# Patient Record
Sex: Female | Born: 1955 | Race: White | Hispanic: No | Smoking: Never smoker
Health system: Southern US, Community
[De-identification: ages and names within clinical notes are randomized; demographics above are authoritative.]

## PROBLEM LIST (undated history)

## (undated) DIAGNOSIS — E119 Type 2 diabetes mellitus without complications: Secondary | ICD-10-CM

## (undated) DIAGNOSIS — T4145XA Adverse effect of unspecified anesthetic, initial encounter: Secondary | ICD-10-CM

## (undated) DIAGNOSIS — B019 Varicella without complication: Secondary | ICD-10-CM

## (undated) DIAGNOSIS — C801 Malignant (primary) neoplasm, unspecified: Secondary | ICD-10-CM

## (undated) DIAGNOSIS — F419 Anxiety disorder, unspecified: Secondary | ICD-10-CM

## (undated) DIAGNOSIS — E785 Hyperlipidemia, unspecified: Secondary | ICD-10-CM

## (undated) DIAGNOSIS — E7800 Pure hypercholesterolemia, unspecified: Secondary | ICD-10-CM

## (undated) DIAGNOSIS — G4733 Obstructive sleep apnea (adult) (pediatric): Secondary | ICD-10-CM

## (undated) DIAGNOSIS — K219 Gastro-esophageal reflux disease without esophagitis: Secondary | ICD-10-CM

## (undated) DIAGNOSIS — J45909 Unspecified asthma, uncomplicated: Secondary | ICD-10-CM

## (undated) DIAGNOSIS — I454 Nonspecific intraventricular block: Secondary | ICD-10-CM

## (undated) DIAGNOSIS — Z9989 Dependence on other enabling machines and devices: Secondary | ICD-10-CM

## (undated) DIAGNOSIS — I10 Essential (primary) hypertension: Secondary | ICD-10-CM

## (undated) DIAGNOSIS — T8859XA Other complications of anesthesia, initial encounter: Secondary | ICD-10-CM

## (undated) DIAGNOSIS — F329 Major depressive disorder, single episode, unspecified: Secondary | ICD-10-CM

## (undated) DIAGNOSIS — R05 Cough: Secondary | ICD-10-CM

## (undated) DIAGNOSIS — J189 Pneumonia, unspecified organism: Secondary | ICD-10-CM

## (undated) DIAGNOSIS — M1990 Unspecified osteoarthritis, unspecified site: Secondary | ICD-10-CM

## (undated) DIAGNOSIS — J42 Unspecified chronic bronchitis: Secondary | ICD-10-CM

## (undated) HISTORY — DX: Major depressive disorder, single episode, unspecified: F32.9

## (undated) HISTORY — PX: BACK SURGERY: SHX140

## (undated) HISTORY — DX: Unspecified asthma, uncomplicated: J45.909

## (undated) HISTORY — DX: Hyperlipidemia, unspecified: E78.5

## (undated) HISTORY — DX: Malignant (primary) neoplasm, unspecified: C80.1

## (undated) HISTORY — DX: Gastro-esophageal reflux disease without esophagitis: K21.9

## (undated) HISTORY — DX: Type 2 diabetes mellitus without complications: E11.9

## (undated) HISTORY — DX: Varicella without complication: B01.9

## (undated) HISTORY — DX: Cough: R05

## (undated) HISTORY — DX: Essential (primary) hypertension: I10

## (undated) HISTORY — DX: Anxiety disorder, unspecified: F41.9

---

## 1992-02-12 HISTORY — PX: CYST EXCISION: SHX5701

## 2003-02-12 ENCOUNTER — Encounter (INDEPENDENT_AMBULATORY_CARE_PROVIDER_SITE_OTHER): Payer: Self-pay | Admitting: Internal Medicine

## 2003-02-12 LAB — CONVERTED CEMR LAB: Pap Smear: NORMAL

## 2003-03-07 ENCOUNTER — Other Ambulatory Visit: Payer: Self-pay

## 2003-07-13 ENCOUNTER — Encounter (INDEPENDENT_AMBULATORY_CARE_PROVIDER_SITE_OTHER): Payer: Self-pay | Admitting: Internal Medicine

## 2003-07-13 LAB — CONVERTED CEMR LAB
Hgb A1c MFr Bld: 7.5 %
Hgb A1c MFr Bld: 7.5 %

## 2003-10-13 ENCOUNTER — Encounter (INDEPENDENT_AMBULATORY_CARE_PROVIDER_SITE_OTHER): Payer: Self-pay | Admitting: Internal Medicine

## 2003-10-13 LAB — CONVERTED CEMR LAB
Hgb A1c MFr Bld: 8.7 %
Hgb A1c MFr Bld: 8.9 %

## 2004-03-14 ENCOUNTER — Encounter (INDEPENDENT_AMBULATORY_CARE_PROVIDER_SITE_OTHER): Payer: Self-pay | Admitting: Internal Medicine

## 2004-03-14 LAB — CONVERTED CEMR LAB
Hgb A1c MFr Bld: 8.6 %
Hgb A1c MFr Bld: 8.6 %
Microalbumin U total vol: 11.1 mg/L
Microalbumin U total vol: 11.1 mg/L
Pap Smear: NORMAL

## 2004-03-16 ENCOUNTER — Ambulatory Visit: Payer: Self-pay | Admitting: Family Medicine

## 2004-03-23 ENCOUNTER — Ambulatory Visit: Payer: Self-pay | Admitting: Family Medicine

## 2004-03-23 ENCOUNTER — Other Ambulatory Visit: Admission: RE | Admit: 2004-03-23 | Discharge: 2004-03-23 | Payer: Self-pay | Admitting: Internal Medicine

## 2004-04-11 ENCOUNTER — Encounter (INDEPENDENT_AMBULATORY_CARE_PROVIDER_SITE_OTHER): Payer: Self-pay | Admitting: Internal Medicine

## 2004-04-11 LAB — CONVERTED CEMR LAB
Hgb A1c MFr Bld: 9.6 %
Hgb A1c MFr Bld: 9.6 %

## 2004-04-24 ENCOUNTER — Ambulatory Visit: Payer: Self-pay | Admitting: Family Medicine

## 2004-05-10 ENCOUNTER — Ambulatory Visit: Payer: Self-pay | Admitting: Family Medicine

## 2004-08-28 ENCOUNTER — Ambulatory Visit: Payer: Self-pay | Admitting: Internal Medicine

## 2004-09-11 ENCOUNTER — Ambulatory Visit: Payer: Self-pay | Admitting: Family Medicine

## 2004-09-11 ENCOUNTER — Encounter (INDEPENDENT_AMBULATORY_CARE_PROVIDER_SITE_OTHER): Payer: Self-pay | Admitting: Internal Medicine

## 2004-09-11 LAB — CONVERTED CEMR LAB
Hgb A1c MFr Bld: 9.6 %
Hgb A1c MFr Bld: 9.6 %

## 2004-10-12 ENCOUNTER — Ambulatory Visit: Payer: Self-pay | Admitting: Family Medicine

## 2004-11-11 ENCOUNTER — Encounter (INDEPENDENT_AMBULATORY_CARE_PROVIDER_SITE_OTHER): Payer: Self-pay | Admitting: Internal Medicine

## 2004-11-11 LAB — CONVERTED CEMR LAB
Hgb A1c MFr Bld: 7 %
Hgb A1c MFr Bld: 7 %
Microalbumin U total vol: 12.7 mg/L
Microalbumin U total vol: 12.7 mg/L

## 2004-11-23 ENCOUNTER — Ambulatory Visit: Payer: Self-pay | Admitting: Family Medicine

## 2004-11-29 ENCOUNTER — Ambulatory Visit: Payer: Self-pay | Admitting: Family Medicine

## 2004-12-12 HISTORY — PX: ANTERIOR CERVICAL DECOMP/DISCECTOMY FUSION: SHX1161

## 2004-12-27 ENCOUNTER — Ambulatory Visit (HOSPITAL_COMMUNITY): Admission: RE | Admit: 2004-12-27 | Discharge: 2004-12-28 | Payer: Self-pay | Admitting: Orthopaedic Surgery

## 2005-03-01 ENCOUNTER — Ambulatory Visit: Payer: Self-pay | Admitting: Family Medicine

## 2005-04-24 ENCOUNTER — Ambulatory Visit: Payer: Self-pay | Admitting: Family Medicine

## 2005-05-02 ENCOUNTER — Ambulatory Visit: Payer: Self-pay | Admitting: Family Medicine

## 2005-05-12 ENCOUNTER — Encounter (INDEPENDENT_AMBULATORY_CARE_PROVIDER_SITE_OTHER): Payer: Self-pay | Admitting: Internal Medicine

## 2005-05-12 LAB — CONVERTED CEMR LAB
Hgb A1c MFr Bld: 6.8 %
Hgb A1c MFr Bld: 6.8 %

## 2005-05-15 ENCOUNTER — Other Ambulatory Visit: Admission: RE | Admit: 2005-05-15 | Discharge: 2005-05-15 | Payer: Self-pay | Admitting: Family Medicine

## 2005-05-15 ENCOUNTER — Ambulatory Visit: Payer: Self-pay | Admitting: Family Medicine

## 2005-07-01 ENCOUNTER — Ambulatory Visit: Payer: Self-pay | Admitting: Family Medicine

## 2005-07-05 ENCOUNTER — Ambulatory Visit: Payer: Self-pay | Admitting: Family Medicine

## 2005-11-21 ENCOUNTER — Ambulatory Visit: Payer: Self-pay | Admitting: *Deleted

## 2005-11-21 ENCOUNTER — Observation Stay (HOSPITAL_COMMUNITY): Admission: EM | Admit: 2005-11-21 | Discharge: 2005-11-21 | Payer: Self-pay | Admitting: Emergency Medicine

## 2005-12-19 ENCOUNTER — Ambulatory Visit: Payer: Self-pay | Admitting: Family Medicine

## 2006-03-14 ENCOUNTER — Encounter (INDEPENDENT_AMBULATORY_CARE_PROVIDER_SITE_OTHER): Payer: Self-pay | Admitting: Internal Medicine

## 2006-03-14 LAB — CONVERTED CEMR LAB
Hgb A1c MFr Bld: 7.6 %
Microalbumin U total vol: 6.6 mg/L
Microalbumin U total vol: 9.6 mg/L

## 2006-04-09 ENCOUNTER — Ambulatory Visit: Payer: Self-pay | Admitting: Family Medicine

## 2006-04-09 LAB — CONVERTED CEMR LAB
ALT: 28 units/L (ref 0–40)
AST: 26 units/L (ref 0–37)
BUN: 17 mg/dL (ref 6–23)
CO2: 27 meq/L (ref 19–32)
Calcium: 9.4 mg/dL (ref 8.4–10.5)
Chloride: 103 meq/L (ref 96–112)
Cholesterol: 216 mg/dL (ref 0–200)
Creatinine, Ser: 0.6 mg/dL (ref 0.4–1.2)
Creatinine,U: 90.4 mg/dL
Direct LDL: 131.4 mg/dL
GFR calc Af Amer: 136 mL/min
GFR calc non Af Amer: 112 mL/min
Glucose, Bld: 182 mg/dL — ABNORMAL HIGH (ref 70–99)
HDL: 31.2 mg/dL — ABNORMAL LOW (ref >39.0)
Hgb A1c MFr Bld: 7.6 % — ABNORMAL HIGH (ref 4.6–6.0)
Microalb Creat Ratio: 6.6 mg/g (ref 0.0–30.0)
Microalb, Ur: 0.6 mg/dL (ref 0.0–1.9)
Potassium: 3.9 meq/L (ref 3.5–5.1)
Sodium: 138 meq/L (ref 135–145)
Total CHOL/HDL Ratio: 6.9
Triglycerides: 438 mg/dL (ref 0–149)
VLDL: 88 mg/dL — ABNORMAL HIGH (ref 0–40)

## 2006-07-25 ENCOUNTER — Encounter: Payer: Self-pay | Admitting: Internal Medicine

## 2006-07-25 DIAGNOSIS — I10 Essential (primary) hypertension: Secondary | ICD-10-CM

## 2006-07-25 DIAGNOSIS — E119 Type 2 diabetes mellitus without complications: Secondary | ICD-10-CM | POA: Insufficient documentation

## 2006-07-25 DIAGNOSIS — E785 Hyperlipidemia, unspecified: Secondary | ICD-10-CM | POA: Insufficient documentation

## 2006-07-25 HISTORY — DX: Essential (primary) hypertension: I10

## 2006-07-27 DIAGNOSIS — G4700 Insomnia, unspecified: Secondary | ICD-10-CM

## 2006-07-29 ENCOUNTER — Other Ambulatory Visit: Admission: RE | Admit: 2006-07-29 | Discharge: 2006-07-29 | Payer: Self-pay | Admitting: Family Medicine

## 2006-07-29 ENCOUNTER — Encounter (INDEPENDENT_AMBULATORY_CARE_PROVIDER_SITE_OTHER): Payer: Self-pay | Admitting: Internal Medicine

## 2006-07-29 ENCOUNTER — Ambulatory Visit: Payer: Self-pay | Admitting: Family Medicine

## 2006-07-29 DIAGNOSIS — H60399 Other infective otitis externa, unspecified ear: Secondary | ICD-10-CM | POA: Insufficient documentation

## 2006-07-29 DIAGNOSIS — N951 Menopausal and female climacteric states: Secondary | ICD-10-CM | POA: Insufficient documentation

## 2006-07-29 LAB — CONVERTED CEMR LAB: Pap Smear: NORMAL

## 2006-07-30 LAB — CONVERTED CEMR LAB
BUN: 13 mg/dL (ref 6–23)
CO2: 26 meq/L (ref 19–32)
Calcium: 9.5 mg/dL (ref 8.4–10.5)
Chloride: 104 meq/L (ref 96–112)
Cholesterol: 244 mg/dL (ref 0–200)
Creatinine, Ser: 0.7 mg/dL (ref 0.4–1.2)
Creatinine,U: 128.2 mg/dL
Direct LDL: 138.9 mg/dL
FSH: 47.5 milliintl units/mL
GFR calc Af Amer: 114 mL/min
GFR calc non Af Amer: 94 mL/min
Glucose, Bld: 188 mg/dL — ABNORMAL HIGH (ref 70–99)
HDL: 30.9 mg/dL — ABNORMAL LOW (ref >39.0)
Hgb A1c MFr Bld: 8 % — ABNORMAL HIGH (ref 4.6–6.0)
Microalb Creat Ratio: 3.9 mg/g (ref 0.0–30.0)
Microalb, Ur: 0.5 mg/dL (ref 0.0–1.9)
Potassium: 4 meq/L (ref 3.5–5.1)
Sodium: 138 meq/L (ref 135–145)
Total CHOL/HDL Ratio: 7.9
Triglycerides: 336 mg/dL (ref 0–149)
VLDL: 67 mg/dL — ABNORMAL HIGH (ref 0–40)

## 2006-07-31 ENCOUNTER — Ambulatory Visit: Payer: Self-pay | Admitting: Internal Medicine

## 2006-07-31 DIAGNOSIS — T50995A Adverse effect of other drugs, medicaments and biological substances, initial encounter: Secondary | ICD-10-CM

## 2006-08-19 ENCOUNTER — Encounter (INDEPENDENT_AMBULATORY_CARE_PROVIDER_SITE_OTHER): Payer: Self-pay | Admitting: Internal Medicine

## 2006-08-22 ENCOUNTER — Ambulatory Visit: Payer: Self-pay | Admitting: Family Medicine

## 2006-11-26 ENCOUNTER — Ambulatory Visit: Payer: Self-pay | Admitting: Internal Medicine

## 2006-11-27 ENCOUNTER — Telehealth (INDEPENDENT_AMBULATORY_CARE_PROVIDER_SITE_OTHER): Payer: Self-pay | Admitting: *Deleted

## 2007-01-01 ENCOUNTER — Ambulatory Visit: Payer: Self-pay | Admitting: Family Medicine

## 2007-04-02 ENCOUNTER — Telehealth: Payer: Self-pay | Admitting: Family Medicine

## 2007-04-22 ENCOUNTER — Telehealth (INDEPENDENT_AMBULATORY_CARE_PROVIDER_SITE_OTHER): Payer: Self-pay | Admitting: Internal Medicine

## 2007-04-28 ENCOUNTER — Telehealth (INDEPENDENT_AMBULATORY_CARE_PROVIDER_SITE_OTHER): Payer: Self-pay | Admitting: Internal Medicine

## 2007-05-08 ENCOUNTER — Encounter (INDEPENDENT_AMBULATORY_CARE_PROVIDER_SITE_OTHER): Payer: Self-pay | Admitting: Nurse Practitioner

## 2007-05-29 ENCOUNTER — Telehealth (INDEPENDENT_AMBULATORY_CARE_PROVIDER_SITE_OTHER): Payer: Self-pay | Admitting: Internal Medicine

## 2007-06-18 ENCOUNTER — Ambulatory Visit: Payer: Self-pay | Admitting: Family Medicine

## 2007-06-18 DIAGNOSIS — J069 Acute upper respiratory infection, unspecified: Secondary | ICD-10-CM | POA: Insufficient documentation

## 2007-07-31 ENCOUNTER — Encounter (INDEPENDENT_AMBULATORY_CARE_PROVIDER_SITE_OTHER): Payer: Self-pay | Admitting: Internal Medicine

## 2007-07-31 ENCOUNTER — Telehealth (INDEPENDENT_AMBULATORY_CARE_PROVIDER_SITE_OTHER): Payer: Self-pay | Admitting: *Deleted

## 2007-08-04 ENCOUNTER — Telehealth (INDEPENDENT_AMBULATORY_CARE_PROVIDER_SITE_OTHER): Payer: Self-pay | Admitting: Internal Medicine

## 2007-08-05 ENCOUNTER — Encounter (INDEPENDENT_AMBULATORY_CARE_PROVIDER_SITE_OTHER): Payer: Self-pay | Admitting: Internal Medicine

## 2008-01-25 ENCOUNTER — Encounter (INDEPENDENT_AMBULATORY_CARE_PROVIDER_SITE_OTHER): Payer: Self-pay | Admitting: *Deleted

## 2010-02-06 ENCOUNTER — Ambulatory Visit: Payer: Self-pay | Admitting: Family Medicine

## 2010-03-04 ENCOUNTER — Encounter: Payer: Self-pay | Admitting: *Deleted

## 2010-06-29 NOTE — H&P (Signed)
NAME:  Michaela Tucker, Michaela Tucker               ACCOUNT NO.:  [id number]   MEDICAL RECORD NO.:  [id number]          PATIENT TYPE:  EMS   LOCATION:  MAJO                         FACILITY:  [facility]   PHYSICIAN:  Noralyn Pick. Eden Emms, MD, FACCDATE OF BIRTH:  [birth year]   DATE OF ADMISSION:  11/20/2005  DATE OF DISCHARGE:                                HISTORY & PHYSICAL   CHIEF COMPLAINT:  Chest pain for one week.   HISTORY OF PRESENT ILLNESS:  [age]-year-old female with a history of  diabetes, hypertriglyceridemia, family history of heart disease and CHF, who  was well until one week ago when she began having staggering chest pain.  She described as a fluttering feeling in her heart, associated with left arm  numbness.  Today, the symptoms became worse and that they increased in  severity and the duration was longer than it had been earlier in the week.  These episodes have been occurring both at rest and on exertion with no  clear triggers or relief.  At the time of my exam, the patient was chest  pain, sitting in the emergency room.  During her 4 hour stay in the  emergency room, she reported that she had 1 recurrence of chest pain that  lasted for just a few seconds.   PAST MEDICAL HISTORY:  1. Diabetes.  2. Hypertriglyceridemia.   ALLERGIES:  CODEINE.   MEDICATIONS:  Metformin, glipizide, Byetta, lisinopril, TriCor and aspirin.   SOCIAL HISTORY:  Lives in [place] with her husband.   FAMILY HISTORY:  Mother died of lung cancer.  Father died of lung cancer.  Grandfather died of heart failure.   REVIEW OF SYSTEMS:  All other review of systems were negative.  Apparently,  the patient wishes to be full code.   PHYSICAL EXAM IN THE EMERGENCY ROOM:  VITAL SIGNS:  Temperature was 98.7.  Blood pressure was 121/62.  Pulse was 73.  Respiratory rate is 18.  Pulse  oximetry is 99%.  GENERAL:  She is in no apparent distress.  She is a mildly obese women.  HEENT EXAM:  Normocephalic, atraumatic.   Pupils equally round and reactive  to light.  Extraocular motions intact.  Dentition is good.  NECK:  Supple.  lymphadenopathy is none.  HEART:  Regular with no murmurs, rubs or gallops.  LUNGS:  Clear to auscultation with no wheezes, rales or rhonchi.  SKIN:  Shows no lesions.  ABDOMEN:  Soft, nontender.  Positive bowel sounds.  EXTREMITIES:  Show no clubbing, cyanosis or edema.  RECTAL EXAM:  Per the ER was heme negative.  MUSCULOSKELETAL:  Shows no gross deformities.  NEUROLOGICALLY:  The patient is alert and oriented x3.  Cranial nerves II-  XII grossly intact.  Strength 5/5 in all extremities and axial groups.  Normal sensation throughout with normal cerebellar function.   EKG shows a normal sinus rhythm with an interval __________ conduction delay  and a borderline first degree AV block with normal axis.   White count was not performed.  Hematocrit is 37.0, sodium was 139,  potassium 3.6, chloride 106, BUN 12, creatinine 0.8,  glucose 100.  Point of  care enzymes were negative x2.   ASSESSMENT/PLAN:  This is a 55 year old female with a history of chest pain  for one week and negative enzymes x2 via the point of care system.  Her EKG  shows no gross injury apparent.  However, given her history of diabetes, her  pretest probability for coronary artery disease is higher than would be for  a normal woman her 55.  Therefore, we will order exercise Cardiolite for  further risk/stratification with regards to coronary artery disease.  With  regards to risk factor modification, the patient has been on an excellent  outpatient regimen, including lisinopril with good diabetic control with her  metformin, glipizide and Byetta.  We will hold the metformin, however, in  lieu of potential tests down the road pending the stress test results.      Daniel B. Haithcock, MD   Electronically Signed     ______________________________  Noralyn Pick. Eden Emms, MD, [facility]    DBH/MEDQ  D:  11/21/2005   T:  11/21/2005  Job:  [id number]

## 2010-06-29 NOTE — Op Note (Signed)
NAME:  Michaela Tucker, SCHWEERS               ACCOUNT NO.:  [id number]   MEDICAL RECORD NO.:  [id number]          PATIENT TYPE:  AMB   LOCATION:  SDS                          FACILITY:  [facility]   PHYSICIAN:  Sharolyn Douglas, M.D.        DATE OF BIRTH:  [birth year]   DATE OF PROCEDURE:  12/27/2004  DATE OF DISCHARGE:                                 OPERATIVE REPORT   DIAGNOSIS:  Cervical spondylotic myeloradiculopathy.   PROCEDURE:  1.  Anterior cervical diskectomy C4-5, C5-6.  2.  Anterior cervical arthrodesis C4-5, C5-6; placement of two allograft      prosthesis spacers packed with local autogenous bone graft.  3.  Anterior cervical plating C4-C6 using the Abbott spine system.   SURGEON:  Sharolyn Douglas, M.D.   ASSISTANT:  Verlin Fester, P.A.   ANESTHESIA:  General endotracheal.   COMPLICATIONS:  None.   INDICATIONS:  The patient is a pleasant 55 year old female with chronic  persistent neck and bilateral upper extremity pain, right greater than left.  She has had increasing problems with her balance. Her radiographs  demonstrate degenerative changes most pronounced at C5-6. 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  At this  time she has elected to undergo ACDF at C4-5, C5-6 with allograft and plate  in hopes of improving her symptoms. Risk and benefits were reviewed.   PROCEDURE:  The patient was identified in holding area, taken to the  operating room and underwent general endotracheal anesthesia without  difficulty, given prophylactic IV antibiotics. Carefully positioned supine  with the [place] head rest, 5 pounds halter traction applied. Neck prepped,  draped usual sterile fashion. All bony prominences were padded. A small  transverse incision was made left side level of the cricoid cartilage in a  natural skin crease. Dissection was carried sharply through platysma. The  interval between the SCM and strap muscles medially was developed down  to  the prevertebral space. Spinal needle placed at C4-5. Intraoperative x-ray  taken to confirm level. Longus coli muscle was elevated out of the C4-5 at  C5-6 disk spaces. The esophagus, trachea, carotid sheath were identified and  protected all times. Caspar distraction pins were placed in C4, C5, C6  vertebral bodies. Gentle distraction applied. The microscope was draped and  brought into the field. Diskectomy was carried back to the posterior  longitudinal ligament starting at C4-5. The disk space was degenerative and  narrowed. The uncovertebral joints and posterior vertebral margins were  taken down with the high-speed bur. The posterior longitudinal ligament was  then removed using the micro Kerrison punches and foraminotomies were  extended out laterally. The vertebral margins were undercut. We then placed  in 8 mm allograft prosthesis spacer which had been packed with local bone  graft from the drill shavings. The prosthesis was countersunk 1 mm. We then  performed a similar procedure at C5-6.  At this level we noted more severe  foraminal narrowing on the right side which was decompressed. A 7 mm  allograft prosthesis spacer was used at C5-6 again packed  with local bone  graft. We then placed a 42 mm Abbott spine anterior cervical plate Z6-X0  with six 13 mm screws. We ensured that the locking mechanism engaged. The  bone quality was average and the screw purchase was good. The wound was  irrigated. Hemostasis was achieved. The esophagus, trachea, carotid sheath  were inspected. There were no injuries. Deep Penrose drain left in place.  The platysma was closed with interrupted 2-0 Vicryl, subcutaneous layer  closed with 3-0 Vicryl, and then a running 4-0 subcuticular Vicryl suture on  the skin edges. Benzoin, Steri-Strips placed.  Cervical collar applied. The  patient was extubated without difficulty, transferred to recovery stable  condition able to move her upper and lower  extremities.      Sharolyn Douglas, M.D.  Electronically Signed     MC/MEDQ  D:  12/27/2004  T:  12/27/2004  Job:  [id number]

## 2010-06-29 NOTE — Discharge Summary (Signed)
Michaela Tucker, Michaela Tucker               ACCOUNT NO.:  [id number]   MEDICAL RECORD NO.:  [id number]          PATIENT TYPE:  INP   LOCATION:  6524                         FACILITY:  [facility]   PHYSICIAN:  Tereso Newcomer, PA-C     DATE OF BIRTH:  1955-04-19   DATE OF ADMISSION:  11/20/2005  DATE OF DISCHARGE:  11/21/2005                                 DISCHARGE SUMMARY   REASON FOR ADMISSION:  Chest pain.   DISCHARGE DIAGNOSES:  1. Non cardiac chest pain.      a.     Non ischemic Myoview this admission.  2. Good LV function by Myoview scan.  3. Treated hypertriglyceridemia.  4. Diabetes mellitus x8 years.  5. Probable gastroesophageal reflux disease.      a.     Proton pump inhibitor initiated this admission.   HISTORY:  Michaela Tucker is a 55 year old female with a history of diabetes  mellitus and hypertriglyceridemia, family history of heart disease and heart  failure, who developed staggering chest discomfort 1 week prior to  admission.  On the date of admission, the symptoms became worse and they  increased in severity and the duration was longer.  These episodes occurred  at rest and with exertion with no clear triggers or relief.  Her pain  started about 4:30 or 5:00 in the evening on the date of admission.  This  was intermittent throughout the evening and she notes that her last episode  of chest discomfort was around 11:00 or 11:30 in the evening on the date of  admission.  She was seen in the emergency room by Dr. Oneida Alar who admitted  her for further evaluation.  She was placed on Lovenox as well as aspirin  and her usual home medications.   HOSPITAL COURSE:  As noted above, the patient was admitted for further  evaluation and treatment of her chest pain.  She ruled out for myocardial  infarction by enzymes.  She underwent stress Myoview test on November 21, 2005.  She exercised for 6 minutes and developed no chest pain or EKG  changes.  Her nuclear images revealed no ischemia  or scarring, an EF of 69%.  At discharge, she did not some symptoms of belching and water brash as well  as occasional feelings of dysphagia.  Therefore, she started on a trial of  proton pump inhibitor and is asked to followup with her primary care  physician.  At this time, she is discharged home in stable condition.  She  can followup with cardiology on a p.r.n. basis.  She will need continued  followup for her diabetes and hypertriglyceridemia with her primary care  physician.   LABS AND ANCILLARY DATA:  White count 8800, hemoglobin 12.6, hematocrit  36.6, platelet count 339,000, INR 0.9, sodium 138, potassium 3.6, chloride  105, CO2 27, glucose 127, BUN 14, creatinine 0.7, total bilirubin 0.4,  alkaline phosphatase 53, AST 25, ALT 25, total protein 7.1, albumin 3.9,  calcium 9.3, magnesium 1.9, hemoglobin A1c 6.8, cardiac markers negative x2,  total cholesterol 203, triglycerides 467, HDL 31, LDL not calculated.  Chest  x-ray on admission no acute findings.  Myoview as noted above negative for  scars, ischemia, EF 69%.   DISCHARGE MEDICATIONS:  1. Omeprazole 20 mg daily.  2. Metformin 1 g daily.  3. Glipizide 5 mg daily.  4. Lisinopril 10 mg daily.  5. Aspirin 81 mg daily.  6. Tricor 145 mg daily.  7. Omega 3 1 g daily.  8. Byetta injection daily.  9. Colace as needed.  10.Furosemide 20 mg b.i.d.  11.Tylenol p.r.n.   DIET:  Low fat, low sodium, low cholesterol diet.  The [employer] was  recommended to the patient today and she knows to notify her primary care  physician if she does indeed want to try this.   ACTIVITY:  She is to increase her activity slowly.   FOLLOWUP:  The patient should followup with Billie Bean or Dr. Hetty Ely in  the next 1-2 weeks.  She should call for an appointment.  She can followup  with cardiology on an as needed basis in the future.           ______________________________  Tereso Newcomer, PA-C     SW/MEDQ  D:  11/21/2005  T:   11/22/2005  Job:  [id number]   cc:   Billie D. Bean, FNP  Arta Silence, MD

## 2011-02-22 ENCOUNTER — Emergency Department (HOSPITAL_COMMUNITY): Payer: BC Managed Care – PPO

## 2011-02-22 ENCOUNTER — Other Ambulatory Visit: Payer: Self-pay

## 2011-02-22 ENCOUNTER — Emergency Department (HOSPITAL_COMMUNITY)
Admission: EM | Admit: 2011-02-22 | Discharge: 2011-02-22 | Disposition: A | Discharge status: Home / Self Care | Payer: BC Managed Care – PPO | Attending: Emergency Medicine | Admitting: Emergency Medicine

## 2011-02-22 ENCOUNTER — Encounter (HOSPITAL_COMMUNITY): Payer: Self-pay | Admitting: *Deleted

## 2011-02-22 DIAGNOSIS — E119 Type 2 diabetes mellitus without complications: Secondary | ICD-10-CM | POA: Insufficient documentation

## 2011-02-22 DIAGNOSIS — R059 Cough, unspecified: Secondary | ICD-10-CM | POA: Insufficient documentation

## 2011-02-22 DIAGNOSIS — Z79899 Other long term (current) drug therapy: Secondary | ICD-10-CM | POA: Insufficient documentation

## 2011-02-22 DIAGNOSIS — R05 Cough: Secondary | ICD-10-CM | POA: Insufficient documentation

## 2011-02-22 MED ORDER — ALBUTEROL SULFATE (5 MG/ML) 0.5% IN NEBU
5.0000 mg | INHALATION_SOLUTION | Freq: Once | RESPIRATORY_TRACT | Status: DC
  Filled 2011-02-22: qty 1

## 2011-02-22 MED ORDER — IPRATROPIUM BROMIDE 0.02 % IN SOLN
0.5000 mg | Freq: Once | RESPIRATORY_TRACT | Status: DC
  Filled 2011-02-22: qty 2.5

## 2011-02-22 MED ORDER — TRAMADOL HCL 50 MG PO TABS
ORAL_TABLET | ORAL | Status: AC
  Filled 2011-02-22: qty 1

## 2011-02-22 MED ORDER — TRAMADOL HCL 50 MG PO TABS: 50.0000 mg | ORAL_TABLET | Freq: Four times a day (QID) | ORAL | Status: AC | PRN

## 2011-02-22 MED ORDER — PREDNISONE 20 MG PO TABS
40.0000 mg | ORAL_TABLET | Freq: Once | ORAL | Status: AC
  Administered 2011-02-22: 40 mg via ORAL
  Filled 2011-02-22: qty 2

## 2011-02-22 MED ORDER — PREDNISONE 10 MG PO TABS: 20.0000 mg | ORAL_TABLET | Freq: Every day | ORAL | Status: DC

## 2011-02-22 MED ORDER — TRAMADOL HCL 50 MG PO TABS
50.0000 mg | ORAL_TABLET | Freq: Once | ORAL | Status: AC
  Administered 2011-02-22: 50 mg via ORAL

## 2011-02-22 MED ORDER — ONDANSETRON HCL 4 MG PO TABS: 4.0000 mg | ORAL_TABLET | Freq: Four times a day (QID) | ORAL | Status: AC

## 2011-02-22 NOTE — ED Notes (Signed)
The pt has had flu-like symptoms for one week.  She has been aching all over with coughing cold and difficulty breathing.  Getting worse

## 2011-02-22 NOTE — ED Notes (Signed)
Family at bedside. 

## 2011-02-22 NOTE — ED Provider Notes (Signed)
History     CSN: [id number]  Arrival date & time 02/22/11  0202   First MD Initiated Contact with Patient 02/22/11 [phone]      Chief Complaint  Patient presents with  . Influenza    (Consider location/radiation/quality/duration/timing/severity/associated sxs/prior treatment) HPI The pt has had flu-like symptoms for one week. She has been aching all over with coughing cold and difficulty breathing. Getting worse  Past Medical History  Diagnosis Date  . Diabetes mellitus     History reviewed. No pertinent past surgical history.  History reviewed. No pertinent family history.  History  Substance Use Topics  . Smoking status: Never Smoker   . Smokeless tobacco: Not on file  . Alcohol Use: Yes    OB History    Grav Para Term Preterm Abortions TAB SAB Ect Mult Living                  Review of Systems Remaining review of systems unremarkable except as noted in history of present illness Allergies  Codeine phosphate and Lovastatin  Home Medications   Current Outpatient Rx  Name Route Sig Dispense Refill  . ALBUTEROL SULFATE HFA 108 (90 BASE) MCG/ACT IN AERS Inhalation Inhale 2 puffs into the lungs every 6 (six) hours as needed. For shortness of breath or wheeze due to current condition.    . AMOXICILLIN-POT CLAVULANATE 875-125 MG PO TABS Oral Take 1 tablet by mouth 2 (two) times daily.    . ASPIRIN EC 81 MG PO TBEC Oral Take 81 mg by mouth daily.    Marland Kitchen CITALOPRAM HYDROBROMIDE 20 MG PO TABS Oral Take 20 mg by mouth every evening.    . FUROSEMIDE 20 MG PO TABS Oral Take 20 mg by mouth 2 (two) times daily as needed. swelling    . IBUPROFEN 800 MG PO TABS Oral Take 800 mg by mouth 2 (two) times daily as needed. For pain    . INSULIN DETEMIR 100 UNIT/ML [place] SOLN Subcutaneous Inject 10 Units into the skin 2 (two) times daily.    Marland Kitchen METFORMIN HCL 1000 MG PO TABS Oral Take 1,000 mg by mouth.    Marland Kitchen PRESCRIPTION MEDICATION  Cholesterol medicine    . QUINAPRIL HCL 20 MG PO TABS Oral  Take 20 mg by mouth at bedtime.    Marland Kitchen ZANAMIVIR 5 MG/BLISTER IN AEPB Inhalation Inhale 2 puffs into the lungs 2 (two) times daily.    Marland Kitchen ONDANSETRON HCL 4 MG PO TABS Oral Take 1 tablet (4 mg total) by mouth every 6 (six) hours. 12 tablet 0  . PREDNISONE 10 MG PO TABS Oral Take 2 tablets (20 mg total) by mouth daily. 15 tablet 0  . TRAMADOL HCL 50 MG PO TABS Oral Take 1 tablet (50 mg total) by mouth every 6 (six) hours as needed for pain. 15 tablet 0    BP 131/68  Pulse 78  Temp(Src) 97.7 F (36.5 C) (Oral)  Resp 22  SpO2 98%  Physical Exam  Nursing note and vitals reviewed. Constitutional: She is oriented to person, place, and time. She appears well-developed and well-nourished. No distress.  HENT:  Head: Normocephalic and atraumatic.  Eyes: Pupils are equal, round, and reactive to light.  Neck: Normal range of motion.  Cardiovascular: Normal rate and intact distal pulses.   Pulmonary/Chest: No respiratory distress. She has no wheezes. She has no rales.  Abdominal: Normal appearance. She exhibits no distension.  Musculoskeletal: Normal range of motion.  Neurological: She is alert and oriented  to person, place, and time. No cranial nerve deficit.  Skin: Skin is warm and dry. No rash noted.  Psychiatric: She has a normal mood and affect. Her behavior is normal.    ED Course  Procedures (including critical care time)  Labs Reviewed - No data to display DG Chest 2 View (Final result)   Result time:02/22/11 0520    Final result by Rad Results In Interface (02/22/11 05:20:42)    Narrative:    *RADIOLOGY REPORT*  Clinical Data: Cough.  Wheezing.  CHEST - 2 VIEW  Comparison: 11/20/2005.  Findings: Mild bilateral basilar atelectasis.  Cardiopericardial silhouette appears within normal limits.  Lower cervical ACDF. Aortic arch atherosclerosis.  No definite airspace disease or effusion.  IMPRESSION: Basilar atelectasis without acute cardiopulmonary disease.  Original Report  Authenticated By: Andreas Newport,      1. Cough       MDM         Nelia Shi, MD 03/02/11 1815

## 2011-07-31 ENCOUNTER — Ambulatory Visit: Payer: Self-pay | Admitting: Family Medicine

## 2014-09-26 ENCOUNTER — Observation Stay (HOSPITAL_COMMUNITY)
Admission: EM | Admit: 2014-09-26 | Discharge: 2014-09-27 | Disposition: A | Discharge status: Home / Self Care | Payer: BLUE CROSS/BLUE SHIELD | Attending: Internal Medicine | Admitting: Internal Medicine

## 2014-09-26 ENCOUNTER — Emergency Department (HOSPITAL_COMMUNITY): Payer: BLUE CROSS/BLUE SHIELD

## 2014-09-26 ENCOUNTER — Encounter (HOSPITAL_COMMUNITY): Payer: Self-pay | Admitting: Emergency Medicine

## 2014-09-26 DIAGNOSIS — E785 Hyperlipidemia, unspecified: Secondary | ICD-10-CM

## 2014-09-26 DIAGNOSIS — Z7982 Long term (current) use of aspirin: Secondary | ICD-10-CM | POA: Diagnosis not present

## 2014-09-26 DIAGNOSIS — E119 Type 2 diabetes mellitus without complications: Secondary | ICD-10-CM | POA: Diagnosis not present

## 2014-09-26 DIAGNOSIS — E780 Pure hypercholesterolemia: Secondary | ICD-10-CM | POA: Insufficient documentation

## 2014-09-26 DIAGNOSIS — E1149 Type 2 diabetes mellitus with other diabetic neurological complication: Secondary | ICD-10-CM

## 2014-09-26 DIAGNOSIS — F411 Generalized anxiety disorder: Secondary | ICD-10-CM | POA: Diagnosis not present

## 2014-09-26 DIAGNOSIS — I10 Essential (primary) hypertension: Secondary | ICD-10-CM | POA: Diagnosis not present

## 2014-09-26 DIAGNOSIS — R079 Chest pain, unspecified: Secondary | ICD-10-CM | POA: Diagnosis not present

## 2014-09-26 DIAGNOSIS — Z79899 Other long term (current) drug therapy: Secondary | ICD-10-CM | POA: Insufficient documentation

## 2014-09-26 HISTORY — DX: Unspecified chronic bronchitis: J42

## 2014-09-26 HISTORY — DX: Obstructive sleep apnea (adult) (pediatric): G47.33

## 2014-09-26 HISTORY — DX: Pneumonia, unspecified organism: J18.9

## 2014-09-26 HISTORY — DX: Anxiety disorder, unspecified: F41.9

## 2014-09-26 HISTORY — DX: Pure hypercholesterolemia, unspecified: E78.00

## 2014-09-26 HISTORY — DX: Hyperlipidemia, unspecified: E78.5

## 2014-09-26 HISTORY — DX: Other complications of anesthesia, initial encounter: T88.59XA

## 2014-09-26 HISTORY — DX: Adverse effect of unspecified anesthetic, initial encounter: T41.45XA

## 2014-09-26 HISTORY — DX: Type 2 diabetes mellitus without complications: E11.9

## 2014-09-26 HISTORY — DX: Unspecified osteoarthritis, unspecified site: M19.90

## 2014-09-26 HISTORY — DX: Dependence on other enabling machines and devices: Z99.89

## 2014-09-26 HISTORY — DX: Nonspecific intraventricular block: I45.4

## 2014-09-26 LAB — BASIC METABOLIC PANEL
Anion gap: 14 (ref 5–15)
BUN: 12 mg/dL (ref 6–20)
CHLORIDE: 98 mmol/L — AB (ref 101–111)
CO2: 23 mmol/L (ref 22–32)
Calcium: 9.8 mg/dL (ref 8.9–10.3)
Creatinine, Ser: 0.88 mg/dL (ref 0.44–1.00)
GFR calc non Af Amer: >60 mL/min (ref >60)
Glucose, Bld: 291 mg/dL — ABNORMAL HIGH (ref 65–99)
POTASSIUM: 3.9 mmol/L (ref 3.5–5.1)
SODIUM: 135 mmol/L (ref 135–145)

## 2014-09-26 LAB — GLUCOSE, CAPILLARY: GLUCOSE-CAPILLARY: 263 mg/dL — AB (ref 65–99)

## 2014-09-26 LAB — CBC
HEMATOCRIT: 39.4 % (ref 36.0–46.0)
Hemoglobin: 14.3 g/dL (ref 12.0–15.0)
MCH: 31.1 pg (ref 26.0–34.0)
MCHC: 36.3 g/dL — ABNORMAL HIGH (ref 30.0–36.0)
MCV: 85.7 fL (ref 78.0–100.0)
Platelets: 272 10*3/uL (ref 150–400)
RBC: 4.6 MIL/uL (ref 3.87–5.11)
RDW: 13.9 % (ref 11.5–15.5)
WBC: 8.1 10*3/uL (ref 4.0–10.5)

## 2014-09-26 LAB — I-STAT TROPONIN, ED: Troponin i, poc: 0 ng/mL (ref 0.00–0.08)

## 2014-09-26 MED ORDER — ZOLPIDEM TARTRATE 5 MG PO TABS: 5.0000 mg | ORAL_TABLET | Freq: Every evening | ORAL | Status: DC | PRN

## 2014-09-26 MED ORDER — ASPIRIN EC 325 MG PO TBEC
325.0000 mg | DELAYED_RELEASE_TABLET | Freq: Every day | ORAL | Status: DC
  Administered 2014-09-27: 325 mg via ORAL
  Filled 2014-09-26: qty 1

## 2014-09-26 MED ORDER — GI COCKTAIL ~~LOC~~: 30.0000 mL | Freq: Four times a day (QID) | ORAL | Status: DC | PRN

## 2014-09-26 MED ORDER — SODIUM CHLORIDE 0.9 % IV SOLN
INTRAVENOUS | Status: DC
  Administered 2014-09-26: 23:00:00 via INTRAVENOUS

## 2014-09-26 MED ORDER — CITALOPRAM HYDROBROMIDE 20 MG PO TABS
20.0000 mg | ORAL_TABLET | Freq: Every day | ORAL | Status: DC
  Administered 2014-09-27: 20 mg via ORAL
  Filled 2014-09-26: qty 1

## 2014-09-26 MED ORDER — ASPIRIN 81 MG PO CHEW
324.0000 mg | CHEWABLE_TABLET | Freq: Once | ORAL | Status: AC
  Administered 2014-09-26: 324 mg via ORAL
  Filled 2014-09-26: qty 4

## 2014-09-26 MED ORDER — ACETAMINOPHEN 325 MG PO TABS: 650.0000 mg | ORAL_TABLET | ORAL | Status: DC | PRN

## 2014-09-26 MED ORDER — SITAGLIPTIN PHOS-METFORMIN HCL 50-1000 MG PO TABS: 1.0000 | ORAL_TABLET | Freq: Every day | ORAL | Status: DC

## 2014-09-26 MED ORDER — HEPARIN SODIUM (PORCINE) 5000 UNIT/ML IJ SOLN: 5000.0000 [IU] | Freq: Three times a day (TID) | INTRAMUSCULAR | Status: DC

## 2014-09-26 MED ORDER — FUROSEMIDE 20 MG PO TABS: 20.0000 mg | ORAL_TABLET | Freq: Two times a day (BID) | ORAL | Status: DC | PRN

## 2014-09-26 MED ORDER — GEMFIBROZIL 600 MG PO TABS
600.0000 mg | ORAL_TABLET | Freq: Two times a day (BID) | ORAL | Status: DC
  Administered 2014-09-27: 600 mg via ORAL
  Filled 2014-09-26 (×3): qty 1

## 2014-09-26 MED ORDER — MAGNESIUM OXIDE 400 (241.3 MG) MG PO TABS
400.0000 mg | ORAL_TABLET | Freq: Every day | ORAL | Status: DC
  Administered 2014-09-27: 400 mg via ORAL
  Filled 2014-09-26: qty 1

## 2014-09-26 MED ORDER — QUINAPRIL HCL 10 MG PO TABS: 20.0000 mg | ORAL_TABLET | Freq: Every day | ORAL | Status: DC

## 2014-09-26 MED ORDER — INSULIN ASPART 100 UNIT/ML ~~LOC~~ SOLN
0.0000 [IU] | Freq: Three times a day (TID) | SUBCUTANEOUS | Status: DC
  Administered 2014-09-27 (×2): 3 [IU] via SUBCUTANEOUS

## 2014-09-26 MED ORDER — ALBUTEROL SULFATE (2.5 MG/3ML) 0.083% IN NEBU: 2.5000 mg | INHALATION_SOLUTION | Freq: Four times a day (QID) | RESPIRATORY_TRACT | Status: DC | PRN

## 2014-09-26 MED ORDER — MAGNESIUM 500 MG PO CAPS: 500.0000 mg | ORAL_CAPSULE | Freq: Every day | ORAL | Status: DC

## 2014-09-26 MED ORDER — METFORMIN HCL 500 MG PO TABS
1000.0000 mg | ORAL_TABLET | Freq: Every day | ORAL | Status: DC
  Administered 2014-09-27: 1000 mg via ORAL
  Filled 2014-09-26: qty 2

## 2014-09-26 MED ORDER — IBUPROFEN 800 MG PO TABS: 800.0000 mg | ORAL_TABLET | Freq: Two times a day (BID) | ORAL | Status: DC | PRN

## 2014-09-26 MED ORDER — MORPHINE SULFATE (PF) 2 MG/ML IV SOLN: 2.0000 mg | INTRAVENOUS | Status: DC | PRN

## 2014-09-26 MED ORDER — ONDANSETRON HCL 4 MG/2ML IJ SOLN: 4.0000 mg | Freq: Four times a day (QID) | INTRAMUSCULAR | Status: DC | PRN

## 2014-09-26 MED ORDER — EXENATIDE ER 2 MG ~~LOC~~ PEN: PEN_INJECTOR | SUBCUTANEOUS | Status: DC

## 2014-09-26 MED ORDER — LINAGLIPTIN 5 MG PO TABS
5.0000 mg | ORAL_TABLET | Freq: Every day | ORAL | Status: DC
  Administered 2014-09-27: 5 mg via ORAL
  Filled 2014-09-26: qty 1

## 2014-09-26 MED ORDER — LISINOPRIL 20 MG PO TABS: 20.0000 mg | ORAL_TABLET | Freq: Every day | ORAL | Status: DC

## 2014-09-26 NOTE — ED Notes (Signed)
Patient transported to X-ray 

## 2014-09-26 NOTE — ED Notes (Signed)
Pt. reports intermittent  left chest pain with SOB and diaphoresis onset today , no pain at triage , denies emesis or cough .

## 2014-09-26 NOTE — ED Provider Notes (Signed)
CSN: [id number]     Arrival date & time 09/26/14  2001 History   First MD Initiated Contact with Patient 09/26/14 2102     Chief Complaint  Patient presents with  . Chest Pain     (Consider location/radiation/quality/duration/timing/severity/associated sxs/prior Treatment) Patient is a 59 y.o. female presenting with chest pain. The history is provided by the patient.  Chest Pain Pain location:  L chest Pain quality: pressure and sharp   Pain radiates to:  L arm (describes over forearm) Pain radiates to the back: no   Pain severity:  Moderate Onset quality:  Gradual Duration:  6 hours Timing:  Intermittent Progression:  Resolved Chronicity:  New Context: at rest   Relieved by:  Nothing Worsened by:  Nothing tried Associated symptoms: no fever   Risk factors: diabetes mellitus and high cholesterol     Past Medical History  Diagnosis Date  . Diabetes mellitus   . Hypercholesterolemia    Past Surgical History  Procedure Laterality Date  . Leg surgery    . Foot surgery     No family history on file. Social History  Substance Use Topics  . Smoking status: Never Smoker   . Smokeless tobacco: None  . Alcohol Use: Yes   OB History    No data available     Review of Systems  Constitutional: Negative for fever.  Cardiovascular: Positive for chest pain.  All other systems reviewed and are negative.     Allergies  Codeine phosphate and Lovastatin  Home Medications   Prior to Admission medications   Medication Sig Start Date End Date Taking? Authorizing Provider  albuterol (PROVENTIL HFA;VENTOLIN HFA) 108 (90 BASE) MCG/ACT inhaler Inhale 2 puffs into the lungs every 6 (six) hours as needed. For shortness of breath or wheeze due to current condition.   Yes Historical Provider, MD  aspirin EC 81 MG tablet Take 81 mg by mouth daily.   Yes Historical Provider, MD  citalopram (CELEXA) 20 MG tablet Take 20 mg by mouth daily.   Yes Historical Provider, MD  Exenatide ER  (BYDUREON) 2 MG PEN Inject into the skin once a week. Every Sunday   Yes Historical Provider, MD  furosemide (LASIX) 20 MG tablet Take 20 mg by mouth 2 (two) times daily as needed. swelling   Yes Historical Provider, MD  gemfibrozil (LOPID) 600 MG tablet Take 600 mg by mouth 2 (two) times daily before a meal.   Yes Historical Provider, MD  ibuprofen (ADVIL,MOTRIN) 800 MG tablet Take 800 mg by mouth 2 (two) times daily as needed. For pain   Yes Historical Provider, MD  Magnesium 500 MG CAPS Take 500 mg by mouth daily.   Yes Historical Provider, MD  quinapril (ACCUPRIL) 20 MG tablet Take 20 mg by mouth at bedtime.   Yes Historical Provider, MD  sitaGLIPtin-metformin (JANUMET) 50-1000 MG per tablet Take 1 tablet by mouth daily.   Yes Historical Provider, MD  predniSONE (DELTASONE) 10 MG tablet Take 2 tablets (20 mg total) by mouth daily. Patient not taking: Reported on 09/26/2014 02/22/11   Leonard Schwartz, MD   BP 135/77 mmHg  Pulse 87  Temp(Src) 98 F (36.7 C) (Oral)  Resp 18  Ht 5\' 4"  (1.626 m)  Wt 209 lb 6.4 oz (94.983 kg)  BMI 35.93 kg/m2  SpO2 97% Physical Exam  Constitutional: She is oriented to person, place, and time. She appears well-developed and well-nourished. No distress.  HENT:  Head: Normocephalic.  Eyes: Conjunctivae are normal.  Neck: Neck supple. No tracheal deviation present.  Cardiovascular: Normal rate, regular rhythm and normal heart sounds.   Pulmonary/Chest: Effort normal and breath sounds normal. No respiratory distress.  Abdominal: Soft. She exhibits no distension. There is no tenderness.  Neurological: She is alert and oriented to person, place, and time.  Skin: Skin is warm and dry.  Psychiatric: She has a normal mood and affect.    ED Course  Procedures (including critical care time) Labs Review Labs Reviewed  BASIC METABOLIC PANEL - Abnormal; Notable for the following:    Chloride 98 (*)    Glucose, Bld 291 (*)    All other components within normal  limits  CBC - Abnormal; Notable for the following:    MCHC 36.3 (*)    All other components within normal limits  GLUCOSE, CAPILLARY - Abnormal; Notable for the following:    Glucose-Capillary 263 (*)    All other components within normal limits  HEMOGLOBIN A1C  TROPONIN I  TROPONIN I  TROPONIN I  I-STAT TROPOININ, ED    Imaging Review Dg Chest 2 View  09/26/2014   CLINICAL DATA:  3 weeks ago pt went to see her PCP and was found to have a right bundle branch blockage, referral to cardiologist for Oct due to non immergence. Squeezing sensation in chest since last night with left shoulder blade tingling and small sharp stabbing sensations in chest today after work. Intermittent. Diaphoresis, nausea since last night. Pain in varicose veins in right leg as well. Hx: DM  EXAM: CHEST  2 VIEW  COMPARISON:  02/22/2011  FINDINGS: Cardiac silhouette normal in size and configuration. Normal mediastinal and hilar contours.  Lungs are clear and are symmetrically aerated. No pleural effusion or pneumothorax.  There are changes from previous anterior cervical spine fusion, stable. Bony thorax is intact.  IMPRESSION: No active cardiopulmonary disease.   Electronically Signed   By: Lajean Manes M.D.   On: 09/26/2014 21:20   I, Leo Grosser, personally reviewed and evaluated these images and lab results as part of my medical decision-making.   EKG Interpretation   Date/Time:  Monday September 26 2014 20:28:14 EDT Ventricular Rate:  98 PR Interval:  174 QRS Duration: 112 QT Interval:  368 QTC Calculation: 469 R Axis:     Text Interpretation:  Normal sinus rhythm Possible Left atrial enlargement  Low voltage QRS Incomplete right bundle branch block Abnormal ECG No  significant change since last tracing Confirmed by Robertt Buda MD, Quillian Quince  ([id number]) on 09/26/2014 9:02:16 PM      MDM   Final diagnoses:  Chest pain, unspecified chest pain type   59 year old female presents with sudden onset chest pain that is  accompanied by diaphoresis and nausea. She did not have emesis during these episodes. It came on intermittently and persistent through the afternoon but was resolved by the time she arrived to the emergency department. She has not taken anything for this. She has several risk factors including family history of coronary artery disease, multiple risk factors and her personal medical history, and her story is potentially concerning for unstable angina.  Heart score is 5, discussed case with hospitalist who will see the patient in the emergency department for admission in the setting of high-risk chest pain with no signs of ACS in the emergency department. Was provided aspirin empirically but no indication for heparinization and patient remained pain-free.    Leo Grosser, MD 09/26/14 [phone]

## 2014-09-26 NOTE — ED Notes (Signed)
Pt returned from xray

## 2014-09-26 NOTE — H&P (Signed)
[facility] History and Physical  Michaela Tucker ZSW:[id number] DOB: 10/10/1955 DOA: 09/26/2014  Referring physician: Leo Grosser, MD PCP: Jonathon Bellows, MD   Chief Complaint: Chest Pain  HPI: Michaela Tucker is a 59 y.o. female with history of anxiety disorder HTN DM Type II hyperlipidemia presents with chest pain. Pain started yesterday in the shoulder blade. Today she states that she started having chest pain. Patient states pain was sharp. She states that there was some radiation down her arm noted. She stats that she did get diaphoretic. She had some SOB associated. She felt nausea also. She has had no palpitations at this time. She states that she does have a BBB found recently and has had her heart "flipping inj her chest" on occasion. She does not smoke does not drink. She states that right now she is pain free. She states that the pain did come on after she had eaten.   Review of Systems:  12 point ROS performed and is unremarkable other than HPI  Past Medical History  Diagnosis Date  . Diabetes mellitus   . Hypercholesterolemia    Past Surgical History  Procedure Laterality Date  . Leg surgery    . Foot surgery     Social History:  reports that she has never smoked. She does not have any smokeless tobacco history on file. She reports that she drinks alcohol. Her drug history is not on file.  Allergies  Allergen Reactions  . Codeine Phosphate     REACTION: Nausea  . Lovastatin     REACTION: Muscle aches    No family history on file.   Prior to Admission medications   Medication Sig Start Date End Date Taking? Authorizing Provider  albuterol (PROVENTIL HFA;VENTOLIN HFA) 108 (90 BASE) MCG/ACT inhaler Inhale 2 puffs into the lungs every 6 (six) hours as needed. For shortness of breath or wheeze due to current condition.   Yes Historical Provider, MD  aspirin EC 81 MG tablet Take 81 mg by mouth daily.   Yes Historical Provider, MD  citalopram (CELEXA) 20 MG  tablet Take 20 mg by mouth daily.   Yes Historical Provider, MD  Exenatide ER (BYDUREON) 2 MG PEN Inject into the skin once a week. Every Sunday   Yes Historical Provider, MD  furosemide (LASIX) 20 MG tablet Take 20 mg by mouth 2 (two) times daily as needed. swelling   Yes Historical Provider, MD  gemfibrozil (LOPID) 600 MG tablet Take 600 mg by mouth 2 (two) times daily before a meal.   Yes Historical Provider, MD  ibuprofen (ADVIL,MOTRIN) 800 MG tablet Take 800 mg by mouth 2 (two) times daily as needed. For pain   Yes Historical Provider, MD  Magnesium 500 MG CAPS Take 500 mg by mouth daily.   Yes Historical Provider, MD  quinapril (ACCUPRIL) 20 MG tablet Take 20 mg by mouth at bedtime.   Yes Historical Provider, MD  sitaGLIPtin-metformin (JANUMET) 50-1000 MG per tablet Take 1 tablet by mouth daily.   Yes Historical Provider, MD  predniSONE (DELTASONE) 10 MG tablet Take 2 tablets (20 mg total) by mouth daily. Patient not taking: Reported on 09/26/2014 02/22/11   Leonard Schwartz, MD   Physical Exam: Filed Vitals:   09/26/14 2018 09/26/14 2130 09/26/14 2200  BP: 136/65 136/85 123/80  Pulse: 103 88 89  Temp: 98.2 F (36.8 C)    TempSrc: Oral    Resp: 16 19 17   Height: 5\' 3"  (1.6 m)  Weight: 85.73 kg (189 lb)    SpO2: 96% 97% 94%    Wt Readings from Last 3 Encounters:  09/26/14 85.73 kg (189 lb)  11/26/06 103.874 kg (229 lb)  08/22/06 103.42 kg (228 lb)    General:  Appears calm and comfortable Eyes: PERRL, normal lids, irises & conjunctiva ENT: grossly normal hearing, lips & tongue Neck: no LAD, masses or thyromegaly Cardiovascular: RRR, no m/r/g. No LE edema Respiratory: CTA bilaterally, no w/r/r. Normal respiratory effort. Abdomen: soft, ntnd Skin: no rash or induration seen on limited exam Musculoskeletal: grossly normal tone BUE/BLE Psychiatric: grossly normal mood and affect Neurologic: grossly non-focal.          Labs on Admission:  Basic Metabolic Panel:  Recent  Labs Lab 09/26/14 2036  NA 135  K 3.9  CL 98*  CO2 23  GLUCOSE 291*  BUN 12  CREATININE 0.88  CALCIUM 9.8   Liver Function Tests: No results for input(s): AST, ALT, ALKPHOS, BILITOT, PROT, ALBUMIN in the last 168 hours. No results for input(s): LIPASE, AMYLASE in the last 168 hours. No results for input(s): AMMONIA in the last 168 hours. CBC:  Recent Labs Lab 09/26/14 2036  WBC 8.1  HGB 14.3  HCT 39.4  MCV 85.7  PLT 272   Cardiac Enzymes: No results for input(s): CKTOTAL, CKMB, CKMBINDEX, TROPONINI in the last 168 hours.  BNP (last 3 results) No results for input(s): BNP in the last 8760 hours.  ProBNP (last 3 results) No results for input(s): PROBNP in the last 8760 hours.  CBG: No results for input(s): GLUCAP in the last 168 hours.  Radiological Exams on Admission: Dg Chest 2 View  09/26/2014   CLINICAL DATA:  3 weeks ago pt went to see her PCP and was found to have a right bundle branch blockage, referral to cardiologist for Oct due to non immergence. Squeezing sensation in chest since last night with left shoulder blade tingling and small sharp stabbing sensations in chest today after work. Intermittent. Diaphoresis, nausea since last night. Pain in varicose veins in right leg as well. Hx: DM  EXAM: CHEST  2 VIEW  COMPARISON:  02/22/2011  FINDINGS: Cardiac silhouette normal in size and configuration. Normal mediastinal and hilar contours.  Lungs are clear and are symmetrically aerated. No pleural effusion or pneumothorax.  There are changes from previous anterior cervical spine fusion, stable. Bony thorax is intact.  IMPRESSION: No active cardiopulmonary disease.   Electronically Signed   By: Lajean Manes M.D.   On: 09/26/2014 21:20      Assessment/Plan Principal Problem:   Chest pain Active Problems:   Anxiety state   Essential hypertension   Diabetes mellitus without complication   Hyperlipidemia   1. Chest Pain -will admit to telemetry -will check  serial enzymes -will get echo in am  2. Anxiety state -Continue with present medications  3. Essential HTN -will monitor pressures -continue accupril lasix  4. DM Type II -will continue with janumet -will check A1C -start on FSBS and SSI coverage as needed  5. Hyperlipidemia -continue with lopid     Code Status: Full Code (must indicate code status--if unknown or must be presumed, indicate so) DVT Prophylaxis:heparin Family Communication: none (indicate person spoken with, if applicable, with phone number if by telephone) Disposition Plan: home (indicate anticipated LOS)  Time spent: 83min  KHAN,SAADAT A [facility] Pager [phone]

## 2014-09-27 ENCOUNTER — Observation Stay (HOSPITAL_BASED_OUTPATIENT_CLINIC_OR_DEPARTMENT_OTHER): Payer: BLUE CROSS/BLUE SHIELD

## 2014-09-27 ENCOUNTER — Encounter (HOSPITAL_COMMUNITY): Payer: Self-pay | Admitting: General Practice

## 2014-09-27 ENCOUNTER — Other Ambulatory Visit: Payer: Self-pay | Admitting: Physician Assistant

## 2014-09-27 DIAGNOSIS — R079 Chest pain, unspecified: Secondary | ICD-10-CM | POA: Diagnosis not present

## 2014-09-27 DIAGNOSIS — E119 Type 2 diabetes mellitus without complications: Secondary | ICD-10-CM | POA: Diagnosis not present

## 2014-09-27 DIAGNOSIS — I10 Essential (primary) hypertension: Secondary | ICD-10-CM

## 2014-09-27 DIAGNOSIS — F411 Generalized anxiety disorder: Secondary | ICD-10-CM

## 2014-09-27 DIAGNOSIS — E785 Hyperlipidemia, unspecified: Secondary | ICD-10-CM

## 2014-09-27 LAB — GLUCOSE, CAPILLARY
GLUCOSE-CAPILLARY: 169 mg/dL — AB (ref 65–99)
Glucose-Capillary: 186 mg/dL — ABNORMAL HIGH (ref 65–99)

## 2014-09-27 LAB — TROPONIN I
Troponin I: <0.03 ng/mL (ref <0.031)
Troponin I: <0.03 ng/mL (ref <0.031)

## 2014-09-27 MED ORDER — NITROGLYCERIN 0.4 MG SL SUBL: 0.4000 mg | SUBLINGUAL_TABLET | SUBLINGUAL | Status: DC | PRN

## 2014-09-27 MED ORDER — PANTOPRAZOLE SODIUM 40 MG PO TBEC: 40.0000 mg | DELAYED_RELEASE_TABLET | Freq: Every day | ORAL | Status: DC

## 2014-09-27 NOTE — Progress Notes (Signed)
  Echocardiogram 2D Echocardiogram has been performed.  Darlina Sicilian M 09/27/2014, 11:59 AM

## 2014-09-27 NOTE — Discharge Instructions (Signed)
Cardiopulmonary Stress Test A cardiopulmonary stress test is an exercise test for your heart and lungs. It measures how well your body uses oxygen. This test is done to:  Look at how well your lungs work.  Look at why you may have trouble breathing.  Marveen Reeks your ability to exercise.  Make an assessment for disability. BEFORE THE TEST  Ask your doctor what medicines you can or cannot take before the test.  Do not eat or drink anything 4 hours before the test or as told by your doctor.  Do not eat or drink foods with caffeine for 24 hours before the test.  Do not smoke on the day of your test.  Bring your inhaler to the test if you have one.  Wear comfortable clothes and shoes to exercise in. THE TEST  Sticky patches (electrodes) will be attached to your chest. These patches help to monitor your heart during the test.  A headpiece will be put on your face. This holds a breathing tube that you will breath through during the test. A nose clip will be attached to your nose.  You will walk on a treadmill or use a stationary bike. How hard and how long you exercise depends on:  Your ability to exercise.  Your blood pressure, breathing, and heart rate (vital signs).  If you start to have chest pain.  If you become short of breath. AFTER THE TEST You will be taken to a rest area. Your blood pressure, breathing, and heart rate will be monitored. Once these are normal, you can go home.  Finding out the results of your test Ask when your test results will be ready. Make sure you get your test results. Document Released: 01/16/2009 Document Revised: 06/14/2013 Document Reviewed: 01/16/2009 [facility] Patient Information 2015 [place], [place]. This information is not intended to replace advice given to you by your health care provider. Make sure you discuss any questions you have with your health care provider.  Cardiac Diet This diet can help prevent heart disease and stroke. Many  factors influence your heart health, including eating and exercise habits. Coronary risk rises a lot with abnormal blood fat (lipid) levels. Cardiac meal planning includes limiting unhealthy fats, increasing healthy fats, and making other small dietary changes. General guidelines are as follows:  Adjust calorie intake to reach and maintain desirable body weight.  Limit total fat intake to less than 30% of total calories. Saturated fat should be less than 7% of calories.  Saturated fats are found in animal products and in some vegetable products. Saturated vegetable fats are found in coconut oil, cocoa butter, palm oil, and palm kernel oil. Read labels carefully to avoid these products as much as possible. Use butter in moderation. Choose tub margarines and oils that have 2 grams of fat or less. Good cooking oils are canola and olive oils.  Practice low-fat cooking techniques. Do not fry food. Instead, broil, bake, boil, steam, grill, roast on a rack, stir-fry, or microwave it. Other fat reducing suggestions include:  Remove the skin from poultry.  Remove all visible fat from meats.  Skim the fat off stews, soups, and gravies before serving them.  Steam vegetables in water or broth instead of sauting them in fat.  Avoid foods with trans fat (or hydrogenated oils), such as commercially fried foods and commercially baked goods. Commercial shortening and deep-frying fats will contain trans fat.  Increase intake of fruits, vegetables, whole grains, and legumes to replace foods high in fat.  Increase consumption of nuts, legumes, and seeds to at least 4 servings weekly. One serving of a legume equals  cup, and 1 serving of nuts or seeds equals  cup.  Choose whole grains more often. Have 3 servings per day (a serving is 1 ounce [oz]).  Eat 4 to 5 servings of vegetables per day. A serving of vegetables is 1 cup of raw leafy vegetables;  cup of raw or cooked cut-up vegetables;  cup of vegetable  juice.  Eat 4 to 5 servings of fruit per day. A serving of fruit is 1 medium whole fruit;  cup of dried fruit;  cup of fresh, frozen, or canned fruit;  cup of 100% fruit juice.  Increase your intake of dietary fiber to 20 to 30 grams per day. Insoluble fiber may help lower your risk of heart disease and may help curb your appetite.  Soluble fiber binds cholesterol to be removed from the blood. Foods high in soluble fiber are dried beans, citrus fruits, oats, apples, bananas, broccoli, Brussels sprouts, and eggplant.  Try to include foods fortified with plant sterols or stanols, such as yogurt, breads, juices, or margarines. Choose several fortified foods to achieve a daily intake of 2 to 3 grams of plant sterols or stanols.  Foods with omega-3 fats can help reduce your risk of heart disease. Aim to have a 3.5 oz portion of fatty fish twice per week, such as salmon, mackerel, albacore tuna, sardines, lake trout, or herring. If you wish to take a fish oil supplement, choose one that contains 1 gram of both DHA and EPA.  Limit processed meats to 2 servings (3 oz portion) weekly.  Limit the sodium in your diet to 1500 milligrams (mg) per day. If you have high blood pressure, talk to a registered dietitian about a DASH (Dietary Approaches to Stop Hypertension) eating plan.  Limit sweets and beverages with added sugar, such as soda, to no more than 5 servings per week. One serving is:   1 tablespoon sugar.  1 tablespoon jelly or jam.   cup sorbet.  1 cup lemonade.   cup regular soda. CHOOSING FOODS Starches  Allowed: Breads: All kinds (wheat, rye, raisin, white, oatmeal, [place], [place], and English muffin bread). Low-fat rolls: English muffins, frankfurter and hamburger buns, bagels, pita bread, tortillas (not fried). Pancakes, waffles, biscuits, and muffins made with recommended oil.  Avoid: Products made with saturated or trans fats, oils, or whole milk products. Butter rolls,  cheese breads, croissants. Commercial doughnuts, muffins, sweet rolls, biscuits, waffles, pancakes, store-bought mixes. Crackers  Allowed: Low-fat crackers and snacks: Animal, graham, rye, saltine (with recommended oil, no lard), oyster, and matzo crackers. Bread sticks, melba toast, rusks, flatbread, pretzels, and light popcorn.  Avoid: High-fat crackers: cheese crackers, butter crackers, and those made with coconut, palm oil, or trans fat (hydrogenated oils). Buttered popcorn. Cereals  Allowed: Hot or cold whole-grain cereals.  Avoid: Cereals containing coconut, hydrogenated vegetable fat, or animal fat. Potatoes / Pasta / Rice  Allowed: All kinds of potatoes, rice, and pasta (such as macaroni, spaghetti, and noodles).  Avoid: Pasta or rice prepared with cream sauce or high-fat cheese. Chow mein noodles, [place] fries. Vegetables  Allowed: All vegetables and vegetable juices.  Avoid: Fried vegetables. Vegetables in cream, butter, or high-fat cheese sauces. Limit coconut. Fruit in cream or custard. Protein  Allowed: Limit your intake of meat, seafood, and poultry to no more than 6 oz (cooked weight) per day. All lean, well-trimmed beef, veal, pork, and  lamb. All chicken and [place] without skin. All fish and shellfish. Wild game: wild duck, rabbit, pheasant, and venison. Egg whites or low-cholesterol egg substitutes may be used as desired. Meatless dishes: recipes with dried beans, peas, lentils, and tofu (soybean curd). Seeds and nuts: all seeds and most nuts.  Avoid: Prime grade and other heavily marbled and fatty meats, such as short ribs, spare ribs, rib eye roast or steak, frankfurters, sausage, bacon, and high-fat luncheon meats, mutton. Caviar. Commercially fried fish. Domestic duck, goose, venison sausage. Organ meats: liver, gizzard, heart, chitterlings, brains, kidney, sweetbreads. Dairy  Allowed: Low-fat cheeses: nonfat or low-fat cottage cheese (1% or 2% fat), cheeses made with  part skim milk, such as mozzarella, farmers, string, or ricotta. (Cheeses should be labeled no more than 2 to 6 grams fat per oz.). Skim (or 1%) milk: liquid, powdered, or evaporated. Buttermilk made with low-fat milk. Drinks made with skim or low-fat milk or cocoa. Chocolate milk or cocoa made with skim or low-fat (1%) milk. Nonfat or low-fat yogurt.  Avoid: Whole milk cheeses, including colby, cheddar, [place], Monterey Jack, [place], [place], [place], American, Swiss, and blue. Creamed cottage cheese, cream cheese. Whole milk and whole milk products, including buttermilk or yogurt made from whole milk, drinks made from whole milk. Condensed milk, evaporated whole milk, and 2% milk. Soups and Combination Foods  Allowed: Low-fat low-sodium soups: broth, dehydrated soups, homemade broth, soups with the fat removed, homemade cream soups made with skim or low-fat milk. Low-fat spaghetti, lasagna, chili, and Spanish rice if low-fat ingredients and low-fat cooking techniques are used.  Avoid: Cream soups made with whole milk, cream, or high-fat cheese. All other soups. Desserts and Sweets  Allowed: Sherbet, fruit ices, gelatins, meringues, and angel food cake. Homemade desserts with recommended fats, oils, and milk products. Jam, jelly, honey, marmalade, sugars, and syrups. Pure sugar candy, such as gum drops, hard candy, jelly beans, marshmallows, mints, and small amounts of dark chocolate.  Avoid: Commercially prepared cakes, pies, cookies, frosting, pudding, or mixes for these products. Desserts containing whole milk products, chocolate, coconut, lard, palm oil, or palm kernel oil. Ice cream or ice cream drinks. Candy that contains chocolate, coconut, butter, hydrogenated fat, or unknown ingredients. Buttered syrups. Fats and Oils  Allowed: Vegetable oils: safflower, sunflower, corn, soybean, cottonseed, sesame, canola, olive, or peanut. Non-hydrogenated margarines. Salad dressing or mayonnaise:  homemade or commercial, made with a recommended oil. Low or nonfat salad dressing or mayonnaise.  Limit added fats and oils to 6 to 8 tsp per day (includes fats used in cooking, baking, salads, and spreads on bread). Remember to count the "hidden fats" in foods.  Avoid: Solid fats and shortenings: butter, lard, salt pork, bacon drippings. Gravy containing meat fat, shortening, or suet. Cocoa butter, coconut. Coconut oil, palm oil, palm kernel oil, or hydrogenated oils: these ingredients are often used in bakery products, nondairy creamers, whipped toppings, candy, and commercially fried foods. Read labels carefully. Salad dressings made of unknown oils, sour cream, or cheese, such as blue cheese and Ro[place]ef[place]. Cream, all kinds: half-and-half, light, heavy, or whipping. Sour cream or cream cheese (even if "light" or low-fat). Nondairy cream substitutes: coffee creamers and sour cream substitutes made with palm, palm kernel, hydrogenated oils, or coconut oil. Beverages  Allowed: Coffee (regular or decaffeinated), tea. Diet carbonated beverages, mineral water. Alcohol: Check with your caregiver. Moderation is recommended.  Avoid: Whole milk, regular sodas, and juice drinks with added sugar. Condiments  Allowed: All seasonings and condiments. Cocoa powder. "  Cream" sauces made with recommended ingredients.  Avoid: Carob powder made with hydrogenated fats. SAMPLE MENU Breakfast   cup orange juice   cup oatmeal  1 slice toast  1 tsp margarine  1 cup skim milk Lunch  [place] sandwich with 2 oz [place], 2 slices bread  Lettuce and tomato slices  Fresh fruit  Carrot sticks  Coffee or tea Snack  Fresh fruit or low-fat crackers Dinner  3 oz lean ground beef  1 baked potato  1 tsp margarine   cup asparagus  Lettuce salad  1 tbs non-creamy dressing   cup peach slices  1 cup skim milk Document Released: 11/07/2007 Document Revised: 07/30/2011 Document Reviewed:  03/30/2013 [employer] Patient Information 2015 [place], [place]. This information is not intended to replace advice given to you by your health care provider. Make sure you discuss any questions you have with your health care provider.

## 2014-09-27 NOTE — Discharge Summary (Signed)
Physician Discharge Summary   Patient ID: Michaela Tucker MRN: [id number] DOB/AGE: 1955/06/05 59 y.o.  Admit date: 09/26/2014 Discharge date: 09/27/2014  Primary Care Physician:  Jonathon Bellows, MD  Discharge Diagnoses:    Atypical chest pain  . Essential hypertension . Anxiety state Diabetes mellitus Hyperlipidemia  Consults:  Cardiology, Dr. Benjamine Mola   Recommendations for Outpatient Follow-up:    Cardiology, outpatient stress test to be arranged,  patient given instructions  TESTS THAT NEED FOLLOW-UP Outpatient stress test   DIET: Heart healthy diet    Allergies:   Allergies  Allergen Reactions  . Codeine Phosphate     REACTION: Nausea  . Lovastatin     REACTION: Muscle aches     Discharge Medications:   Medication List    STOP taking these medications        predniSONE 10 MG tablet  Commonly known as:  DELTASONE      TAKE these medications        albuterol 108 (90 BASE) MCG/ACT inhaler  Commonly known as:  PROVENTIL HFA;VENTOLIN HFA  Inhale 2 puffs into the lungs every 6 (six) hours as needed. For shortness of breath or wheeze due to current condition.     aspirin EC 81 MG tablet  Take 81 mg by mouth daily.     BYDUREON 2 MG Pen  Generic drug:  Exenatide ER  Inject into the skin once a week. Every Sunday     citalopram 20 MG tablet  Commonly known as:  CELEXA  Take 20 mg by mouth daily.     furosemide 20 MG tablet  Commonly known as:  LASIX  Take 20 mg by mouth 2 (two) times daily as needed. swelling     gemfibrozil 600 MG tablet  Commonly known as:  LOPID  Take 600 mg by mouth 2 (two) times daily before a meal.     ibuprofen 800 MG tablet  Commonly known as:  ADVIL,MOTRIN  Take 800 mg by mouth 2 (two) times daily as needed. For pain     Magnesium 500 MG Caps  Take 500 mg by mouth daily.     nitroGLYCERIN 0.4 MG SL tablet  Commonly known as:  NITROSTAT  Place 1 tablet (0.4 mg total) under the tongue every 5 (five) minutes as  needed for chest pain.     pantoprazole 40 MG tablet  Commonly known as:  PROTONIX  Take 1 tablet (40 mg total) by mouth daily.     quinapril 20 MG tablet  Commonly known as:  ACCUPRIL  Take 20 mg by mouth at bedtime.     sitaGLIPtin-metformin 50-1000 MG per tablet  Commonly known as:  JANUMET  Take 1 tablet by mouth daily.         Brief H and P: For complete details please refer to admission H and P, but in brief patient is a 59 year old female with anxiety disorder, hypertension, diabetes type 2, hyperlipidemia presented with chest pain that started in the shoulder blade. Patient reported that pain was sharp with some radiation down her arm, did get diaphoretic with some shortness of breath associated as well as nausea. She had no palpitations patient also reported that she had right bundle branch block found recently and her PCP had arranged outpatient follow-up with Dr. Mare Ferrari in October. At the time of the ER admission, chest pain had resolved.  Hospital Course:     Chest pain this factors of hypertension, diabetes, hyperlipidemia. Patient described the chest pain  is fleeting and transient - Patient was admitted to telemetry, ruled out for acute ACS. EKG did not show acute ST-T wave changes suggestive of ischemia. Cardiology consult was called given her risk factors. Per cardiology recommendations, outpatient Cardiolite stress arranged for tomorrow 8/17 at [facility] office at 12pm and instructions were given to the patient. 2-D echo showed EF of 93-71%, grade 1 diastolic dysfunction     Anxiety state Currently stable    Essential hypertension - Stable, continue Accupril, Lasix    Diabetes mellitus without complication - Continue Janumet, follow hemoglobin A1c outpatient    Hyperlipidemia Continue Lopid  Day of Discharge BP 121/70 mmHg  Pulse 84  Temp(Src) 97.7 F (36.5 C) (Oral)  Resp 18  Ht 5\' 4"  (1.626 m)  Wt 94.983 kg (209 lb 6.4 oz)  BMI 35.93 kg/m2  SpO2  93%  Physical Exam: General: Alert and awake oriented x3 not in any acute distress. HEENT: anicteric sclera, pupils reactive to light and accommodation CVS: S1-S2 clear no murmur rubs or gallops Chest: clear to auscultation bilaterally, no wheezing rales or rhonchi Abdomen: soft nontender, nondistended, normal bowel sounds Extremities: no cyanosis, clubbing or edema noted bilaterally Neuro: Cranial nerves II-XII intact, no focal neurological deficits   The results of significant diagnostics from this hospitalization (including imaging, microbiology, ancillary and laboratory) are listed below for reference.    LAB RESULTS: Basic Metabolic Panel:  Recent Labs Lab 09/26/14 2036  NA 135  K 3.9  CL 98*  CO2 23  GLUCOSE 291*  BUN 12  CREATININE 0.88  CALCIUM 9.8   Liver Function Tests: No results for input(s): AST, ALT, ALKPHOS, BILITOT, PROT, ALBUMIN in the last 168 hours. No results for input(s): LIPASE, AMYLASE in the last 168 hours. No results for input(s): AMMONIA in the last 168 hours. CBC:  Recent Labs Lab 09/26/14 2036  WBC 8.1  HGB 14.3  HCT 39.4  MCV 85.7  PLT 272   Cardiac Enzymes:  Recent Labs Lab 09/27/14 0147 09/27/14 0449  TROPONINI <0.03 <0.03   BNP: Invalid input(s): POCBNP CBG:  Recent Labs Lab 09/27/14 0730 09/27/14 1131  GLUCAP 169* 186*    Significant Diagnostic Studies:  Dg Chest 2 View  09/26/2014   CLINICAL DATA:  3 weeks ago pt went to see her PCP and was found to have a right bundle branch blockage, referral to cardiologist for Oct due to non immergence. Squeezing sensation in chest since last night with left shoulder blade tingling and small sharp stabbing sensations in chest today after work. Intermittent. Diaphoresis, nausea since last night. Pain in varicose veins in right leg as well. Hx: DM  EXAM: CHEST  2 VIEW  COMPARISON:  02/22/2011  FINDINGS: Cardiac silhouette normal in size and configuration. Normal mediastinal and hilar  contours.  Lungs are clear and are symmetrically aerated. No pleural effusion or pneumothorax.  There are changes from previous anterior cervical spine fusion, stable. Bony thorax is intact.  IMPRESSION: No active cardiopulmonary disease.   Electronically Signed   By: Lajean Manes M.D.   On: 09/26/2014 21:20    2D ECHO:  Study Conclusions  - Left ventricle: The cavity size was normal. Wall thickness was increased in a pattern of mild LVH. Systolic function was normal. The estimated ejection fraction was in the range of 55% to 60%. Wall motion was normal; there were no regional wall motion abnormalities. Doppler parameters are consistent with abnormal left ventricular relaxation (grade 1 diastolic dysfunction). - Aortic valve: There was  no stenosis. - Mitral valve: There was no significant regurgitation. - Right ventricle: The cavity size was normal. Systolic function was normal. - Pulmonary arteries: PA peak pressure: 17 mm Hg (S). - Inferior vena cava: The vessel was normal in size. The respirophasic diameter changes were in the normal range (>= 50%), consistent with normal central venous pressure.  Impressions:  - Normal LV size with mild LV hypertrophy. EF 55-60%. Normal RV size and systolic function. No significant valvular abnormalities. Disposition and Follow-up:    DISPOSITION: Home   DISCHARGE FOLLOW-UP Follow-up Information    Follow up with [facility] On 09/28/2014.   Why:  [address] @ 12pm for your stress test.    Contact information:   [place] [id number] [phone]      Follow up with Jonathon Bellows, MD. Schedule an appointment as soon as possible for a visit in 10 days.   Specialty:  Family Medicine   Why:  for hospital follow-up   Contact information:   [place] [address] [phone]        Time spent  on Discharge: 35 minutes  Signed:   Obdulio Mash M.D. [facility] 09/27/2014, 2:22 PM Pager: [phone]

## 2014-09-27 NOTE — Consult Note (Signed)
CARDIOLOGY CONSULT NOTE  Patient ID: Michaela Tucker MRN: [id number] DOB/AGE: Apr 27, 1955 59 y.o.  Admit date: 09/26/2014 Primary Cardiologist: New  Reason for Consultation: Chest pain  HPI: 59 yo with history of HTN, DM, hyperlipidemia was admitted with chest pain.  For about a month, she has had episodes of a "pinging or twisting" left-sided chest pain.  The episodes of pain last for a few seconds then resolve completely.  She has not had any longer episodes.  They tend to occur several times a week.  No trigger, they are not exertional.  She had an episode of pain yesterday and decided to come to the ER because she had some associated nausea and felt a very sharp pain in her posterior left shoulder.  She has had no further chest pain in the hospital.  Cardiac enzymes negative x 3 and ECG unchanged from prior (incomplete RBBB).    Review of systems complete and found to be negative unless listed above in HPI  Past Medical History: 1. HTN 2. Type II DM 3. Hyperlipidemia 4. Cardiolite 10/07 with no ischemia/infarction 5. OSA on CPAP  FH: No premature CAD  Social History   Social History  . Marital Status: Widowed    Spouse Name: N/A  . Number of Children: N/A  . Years of Education: N/A   Occupational History  . Not on file.   Social History Main Topics  . Smoking status: Never Smoker   . Smokeless tobacco: Never Used  . Alcohol Use: Yes     Comment: 09/27/2014 "glass of wine once/month; might have a beer once/month too"  . Drug Use: No  . Sexual Activity: Yes   Other Topics Concern  . Not on file   Social History Narrative     Prescriptions prior to admission  Medication Sig Dispense Refill Last Dose  . albuterol (PROVENTIL HFA;VENTOLIN HFA) 108 (90 BASE) MCG/ACT inhaler Inhale 2 puffs into the lungs every 6 (six) hours as needed. For shortness of breath or wheeze due to current condition.   unknown  . aspirin EC 81 MG tablet Take 81 mg by mouth daily.    09/25/2014 at Unknown time  . citalopram (CELEXA) 20 MG tablet Take 20 mg by mouth daily.   09/25/2014 at Unknown time  . Exenatide ER (BYDUREON) 2 MG PEN Inject into the skin once a week. Every Sunday   09/25/2014 at Unknown time  . furosemide (LASIX) 20 MG tablet Take 20 mg by mouth 2 (two) times daily as needed. swelling   unknown  . gemfibrozil (LOPID) 600 MG tablet Take 600 mg by mouth 2 (two) times daily before a meal.   09/26/2014 at Unknown time  . ibuprofen (ADVIL,MOTRIN) 800 MG tablet Take 800 mg by mouth 2 (two) times daily as needed. For pain   Past Week at Unknown time  . Magnesium 500 MG CAPS Take 500 mg by mouth daily.   09/25/2014 at Unknown time  . quinapril (ACCUPRIL) 20 MG tablet Take 20 mg by mouth at bedtime.   09/25/2014 at Unknown time  . sitaGLIPtin-metformin (JANUMET) 50-1000 MG per tablet Take 1 tablet by mouth daily.   09/25/2014 at Unknown time  . predniSONE (DELTASONE) 10 MG tablet Take 2 tablets (20 mg total) by mouth daily. (Patient not taking: Reported on 09/26/2014) 15 tablet 0 Completed Course at Unknown time    Physical exam Blood pressure 129/76, pulse 79, temperature 97.8 F (36.6 C), temperature source Oral, resp. rate 18,  height 5\' 4"  (1.626 m), weight 209 lb 6.4 oz (94.983 kg), SpO2 94 %. General: NAD Neck: No JVD, no thyromegaly or thyroid nodule.  Lungs: Clear to auscultation bilaterally with normal respiratory effort. CV: Nondisplaced PMI.  Heart regular S1/S2, no S3/S4, no murmur.  No peripheral edema.  No carotid bruit.  Normal pedal pulses.  Abdomen: Soft, nontender, no hepatosplenomegaly, no distention.  Skin: Intact without lesions or rashes.  Neurologic: Alert and oriented x 3.  Psych: Normal affect. Extremities: No clubbing or cyanosis.  HEENT: Normal.   Labs:   Lab Results  Component Value Date   WBC 8.1 09/26/2014   HGB 14.3 09/26/2014   HCT 39.4 09/26/2014   MCV 85.7 09/26/2014   PLT 272 09/26/2014    Recent Labs Lab 09/26/14 2036    NA 135  K 3.9  CL 98*  CO2 23  BUN 12  CREATININE 0.88  CALCIUM 9.8  GLUCOSE 291*   Lab Results  Component Value Date   TROPONINI <0.03 09/27/2014  Troponin negative x 3    Radiology: - CXR: No active disease  EKG: NSR, iRBBB  ASSESSMENT AND PLAN: 59 yo with history of HTN, DM, hyperlipidemia was admitted with chest pain.  Chest pain is very atypical and momentary.  Probably noncardiac but she does have significant risk factors.  She has ruled out for MI and ECG is benign.  I think she can go home, will arrange outpatient Cardiolite later this week.   Signed: Loralie Champagne 09/27/2014 10:44 AM

## 2014-09-28 ENCOUNTER — Ambulatory Visit (HOSPITAL_COMMUNITY): Payer: BLUE CROSS/BLUE SHIELD

## 2014-09-28 LAB — HEMOGLOBIN A1C
HEMOGLOBIN A1C: 9.1 % — AB (ref 4.8–5.6)
MEAN PLASMA GLUCOSE: 214 mg/dL

## 2014-09-29 ENCOUNTER — Telehealth (HOSPITAL_COMMUNITY): Payer: Self-pay

## 2014-09-29 NOTE — Telephone Encounter (Signed)
Left message on voicemail in reference to upcoming appointment scheduled for 10-04-2014. Phone number given for a call back so details instructions can be given. Michaela Tucker A    

## 2014-10-03 ENCOUNTER — Telehealth (HOSPITAL_COMMUNITY): Payer: Self-pay

## 2014-10-03 NOTE — Telephone Encounter (Signed)
Encounter complete. 

## 2014-10-04 ENCOUNTER — Encounter: Payer: Self-pay | Admitting: *Deleted

## 2014-10-04 ENCOUNTER — Ambulatory Visit (HOSPITAL_COMMUNITY): Payer: BLUE CROSS/BLUE SHIELD | Attending: Cardiovascular Disease

## 2014-10-04 DIAGNOSIS — R079 Chest pain, unspecified: Secondary | ICD-10-CM

## 2014-10-04 DIAGNOSIS — R002 Palpitations: Secondary | ICD-10-CM | POA: Diagnosis not present

## 2014-10-04 DIAGNOSIS — R0609 Other forms of dyspnea: Secondary | ICD-10-CM | POA: Insufficient documentation

## 2014-10-04 DIAGNOSIS — R0602 Shortness of breath: Secondary | ICD-10-CM | POA: Insufficient documentation

## 2014-10-04 LAB — MYOCARDIAL PERFUSION IMAGING
CHL CUP NUCLEAR SRS: 0
CHL CUP NUCLEAR SSS: 0
LHR: 0.3
LV sys vol: 38 mL
LVDIAVOL: 92 mL
NUC STRESS TID: 1
Peak HR: 104 {beats}/min
Rest HR: 78 {beats}/min
SDS: 0

## 2014-10-04 MED ORDER — TECHNETIUM TC 99M SESTAMIBI GENERIC - CARDIOLITE
31.7000 | Freq: Once | INTRAVENOUS | Status: AC | PRN
  Administered 2014-10-04: 31.7 via INTRAVENOUS

## 2014-10-04 MED ORDER — REGADENOSON 0.4 MG/5ML IV SOLN
0.4000 mg | Freq: Once | INTRAVENOUS | Status: AC
  Administered 2014-10-04: 0.4 mg via INTRAVENOUS

## 2014-10-04 MED ORDER — TECHNETIUM TC 99M SESTAMIBI GENERIC - CARDIOLITE
11.0000 | Freq: Once | INTRAVENOUS | Status: AC | PRN
  Administered 2014-10-04: 11 via INTRAVENOUS

## 2014-10-05 ENCOUNTER — Telehealth: Payer: Self-pay

## 2014-10-05 NOTE — Telephone Encounter (Signed)
New message ° ° ° ° °Pt want stress test results. °

## 2014-10-05 NOTE — Telephone Encounter (Signed)
Pt anxiously awaiting results. Pt told providers needs to review and someone will call her as soon as it is result. Pt verbalized understanding and no has not additional questions at this time.

## 2014-10-06 ENCOUNTER — Telehealth: Payer: Self-pay

## 2014-10-06 ENCOUNTER — Encounter: Payer: Self-pay | Admitting: Physician Assistant

## 2014-10-06 NOTE — Telephone Encounter (Signed)
You can tell her the stress test looks great. "Normal stress nuclear study with no ischemia or infarction; EF 59 with normal wall motion." No cardiology follow up necessary and to see her PCP. Thanks Maudie Mercury!

## 2014-10-06 NOTE — Telephone Encounter (Signed)
Spoke with pt and informed her that stress test has not been resulted at this time. Informed pt that once this was resulted that we would be in contact with her with the results. Pt verbalized understanding.

## 2014-10-06 NOTE — Telephone Encounter (Signed)
New message       Pt calling again for stress test results.

## 2014-10-06 NOTE — Telephone Encounter (Signed)
Pt aware of stress test results very appreciative. Pt stated she no longer needs the new pt appt with Dr. Mare Ferrari scheduled for 10/3.  appt canceled per pt request.

## 2014-11-14 ENCOUNTER — Ambulatory Visit: Payer: Self-pay | Admitting: Cardiology

## 2014-12-26 ENCOUNTER — Other Ambulatory Visit: Payer: Self-pay | Admitting: Family Medicine

## 2014-12-27 ENCOUNTER — Other Ambulatory Visit: Payer: Self-pay | Admitting: Family Medicine

## 2014-12-27 DIAGNOSIS — Z1231 Encounter for screening mammogram for malignant neoplasm of breast: Secondary | ICD-10-CM

## 2014-12-29 ENCOUNTER — Ambulatory Visit
Admission: RE | Admit: 2014-12-29 | Discharge: 2014-12-29 | Disposition: A | Discharge status: Home / Self Care | Payer: BLUE CROSS/BLUE SHIELD | Source: Ambulatory Visit | Attending: Family Medicine | Admitting: Family Medicine

## 2014-12-29 DIAGNOSIS — Z1231 Encounter for screening mammogram for malignant neoplasm of breast: Secondary | ICD-10-CM | POA: Diagnosis present

## 2015-03-02 ENCOUNTER — Other Ambulatory Visit (HOSPITAL_COMMUNITY)
Admission: RE | Admit: 2015-03-02 | Discharge: 2015-03-02 | Disposition: A | Discharge status: Home / Self Care | Payer: BLUE CROSS/BLUE SHIELD | Source: Ambulatory Visit | Attending: Family Medicine | Admitting: Family Medicine

## 2015-03-02 ENCOUNTER — Other Ambulatory Visit: Payer: Self-pay | Admitting: Family Medicine

## 2015-03-02 DIAGNOSIS — Z01411 Encounter for gynecological examination (general) (routine) with abnormal findings: Secondary | ICD-10-CM | POA: Diagnosis present

## 2015-03-02 DIAGNOSIS — Z1151 Encounter for screening for human papillomavirus (HPV): Secondary | ICD-10-CM | POA: Diagnosis present

## 2015-03-06 LAB — CYTOLOGY - PAP

## 2015-03-23 ENCOUNTER — Ambulatory Visit
Admission: RE | Admit: 2015-03-23 | Discharge: 2015-03-23 | Disposition: A | Discharge status: Home / Self Care | Payer: BLUE CROSS/BLUE SHIELD | Source: Ambulatory Visit | Attending: Family Medicine | Admitting: Family Medicine

## 2015-03-23 ENCOUNTER — Other Ambulatory Visit: Payer: Self-pay | Admitting: Family Medicine

## 2015-03-23 DIAGNOSIS — R059 Cough, unspecified: Secondary | ICD-10-CM

## 2015-03-23 DIAGNOSIS — R05 Cough: Secondary | ICD-10-CM

## 2015-04-25 ENCOUNTER — Encounter: Payer: Self-pay | Admitting: Family Medicine

## 2015-04-25 ENCOUNTER — Ambulatory Visit (INDEPENDENT_AMBULATORY_CARE_PROVIDER_SITE_OTHER): Payer: BLUE CROSS/BLUE SHIELD | Admitting: Family Medicine

## 2015-04-25 DIAGNOSIS — F32A Depression, unspecified: Secondary | ICD-10-CM

## 2015-04-25 DIAGNOSIS — R05 Cough: Secondary | ICD-10-CM | POA: Diagnosis not present

## 2015-04-25 DIAGNOSIS — F419 Anxiety disorder, unspecified: Secondary | ICD-10-CM

## 2015-04-25 DIAGNOSIS — R053 Chronic cough: Secondary | ICD-10-CM

## 2015-04-25 DIAGNOSIS — F329 Major depressive disorder, single episode, unspecified: Secondary | ICD-10-CM | POA: Insufficient documentation

## 2015-04-25 HISTORY — DX: Depression, unspecified: F32.A

## 2015-04-25 HISTORY — DX: Anxiety disorder, unspecified: F41.9

## 2015-04-25 MED ORDER — HYDROCOD POLST-CPM POLST ER 10-8 MG/5ML PO SUER: 5.0000 mL | Freq: Two times a day (BID) | ORAL | Status: DC | PRN

## 2015-04-25 MED ORDER — BENZONATATE 100 MG PO CAPS: 100.0000 mg | ORAL_CAPSULE | Freq: Three times a day (TID) | ORAL | Status: DC | PRN

## 2015-04-25 MED ORDER — METHYLPREDNISOLONE ACETATE 80 MG/ML IJ SUSP
80.0000 mg | Freq: Once | INTRAMUSCULAR | Status: AC
  Administered 2015-04-25: 80 mg via INTRAMUSCULAR

## 2015-04-25 MED ORDER — ALBUTEROL SULFATE HFA 108 (90 BASE) MCG/ACT IN AERS: 2.0000 | INHALATION_SPRAY | Freq: Four times a day (QID) | RESPIRATORY_TRACT | Status: AC | PRN

## 2015-04-25 NOTE — Progress Notes (Signed)
Pre visit review using our clinic review tool, if applicable. No additional management support is needed unless otherwise documented below in the visit note. 

## 2015-04-25 NOTE — Patient Instructions (Signed)
Take the medication as prescribed.  We will call with the appt time regarding your pulmonary function tests.  Please follow up with me in ~ 2 weeks or sooner if needed.  Continue your current medications and watch your sugar carefully.  Call with concerns.  Take care  Dr. Lacinda Axon

## 2015-04-26 ENCOUNTER — Institutional Professional Consult (permissible substitution): Payer: BLUE CROSS/BLUE SHIELD | Admitting: Internal Medicine

## 2015-04-26 ENCOUNTER — Encounter: Payer: Self-pay | Admitting: Family Medicine

## 2015-04-26 DIAGNOSIS — R053 Chronic cough: Secondary | ICD-10-CM | POA: Insufficient documentation

## 2015-04-26 DIAGNOSIS — K219 Gastro-esophageal reflux disease without esophagitis: Secondary | ICD-10-CM | POA: Insufficient documentation

## 2015-04-26 DIAGNOSIS — R05 Cough: Secondary | ICD-10-CM | POA: Insufficient documentation

## 2015-04-26 HISTORY — DX: Chronic cough: R05.3

## 2015-04-26 HISTORY — DX: Gastro-esophageal reflux disease without esophagitis: K21.9

## 2015-04-26 NOTE — Assessment & Plan Note (Signed)
New problem. Unclear etiology at this time. DDX: Asthma, COPD, Bronchiectasis, ILD, Upper airway cough syndrome, viral illness. IM depo medrol given today (patient has not received steroids during illness). Tussionex and tessalon as needed. Albuterol as needed. Arranging PFT's to assess for underlying asthma/copd. Follow up in 2 weeks.

## 2015-04-26 NOTE — Progress Notes (Signed)
Subjective:  Patient ID: Michaela Tucker, female    DOB: [birth year]  Age: 60 y.o. MRN: [id number]  CC: Cough  HPI Michaela Tucker is a 60 y.o. female presents to the clinic today as a new patient with complaints of cough.  Cough  Patient reports that she has been sick since November.   She reports she has be plagued by severe cough.  She has been seen several times for this (by her PCP office in [place]).  She has received 3 rounds of antibiotics and was diagnosed with pneumonia during this period of time.  She has some brief improvement with antibiotic therapy but no resolution.  Reports she has 2 xrays one which confirmed resolution of PNA.   She continues to have severe cough; mildly productive.  No recent fever.   Reports a history of asthma. History also revealed that she has had "chronic bronchitis".  She reports SOB with cough.  No known relieving factors.  Denies significant postnasal drip. On PPI for GERD. No current medications for asthma or chronic bronchitis.   PMH, Surgical Hx, Family Hx, Social History reviewed and updated as below.  Past Medical History  Diagnosis Date  . Hypercholesterolemia   . Complication of anesthesia     "it's harder for me to wake up cause of my sleep apnea" (09/27/2014)  . Pneumonia   . Chronic bronchitis ([place])   . OSA on CPAP   . Type II diabetes mellitus ([place])   . Arthritis   . Anxiety   . BBB (bundle branch block)   . Chicken pox   . Asthma   . Cancer [facility])     skin   Past Surgical History  Procedure Laterality Date  . Cesarean section  1995  . Cyst excision  1994  . Back surgery    . Anterior cervical decomp/discectomy fusion  12/2004   Family History  Problem Relation Age of Onset  . Breast cancer Cousin 50    maternal side  . Lung cancer Mother   . Lung cancer Father   . Hyperlipidemia Father    Social History  Substance Use Topics  . Smoking status: Never Smoker   . Smokeless tobacco: Never  Used  . Alcohol Use: 1.2 oz/week    1 Glasses of wine, 1 Cans of beer per week     Comment: 09/27/2014 "glass of wine once/month; might have a beer once/month too"    Review of Systems  HENT:       Hoarseness.  Respiratory: Positive for cough and shortness of breath.   Genitourinary:       Stress incontinence (incontinence due to cough).  All other systems reviewed and are negative.   Objective:   Today's Vitals: BP 112/78 mmHg  Pulse 91  Temp(Src) 98.3 F (36.8 C) (Oral)  Ht 5\' 5"  (1.651 m)  Wt 215 lb 4 oz (97.637 kg)  BMI 35.82 kg/m2  SpO2 94%  Physical Exam  Constitutional:  Well developed. Appears fatigued.  HENT:  Head: Normocephalic and atraumatic.  Mouth/Throat: Oropharynx is clear and moist.  Eyes: Conjunctivae are normal. No scleral icterus.  Neck: Neck supple.  Cardiovascular: Normal rate and regular rhythm.   Pulmonary/Chest: Effort normal and breath sounds normal.  Severe cough noted but lungs CTAB.  Abdominal: Soft. She exhibits no distension. There is no tenderness.  Musculoskeletal: Normal range of motion. She exhibits no edema.  Lymphadenopathy:    She has no cervical adenopathy.  Neurological: She is  alert.  Skin: No rash noted.  Psychiatric: She has a normal mood and affect.  Vitals reviewed.  Assessment & Plan:   Problem List Items Addressed This Visit    Chronic cough    New problem. Unclear etiology at this time. DDX: Asthma, COPD, Bronchiectasis, ILD, Upper airway cough syndrome, viral illness. IM depo medrol given today (patient has not received steroids during illness). Tussionex and tessalon as needed. Albuterol as needed. Arranging PFT's to assess for underlying asthma/copd. Follow up in 2 weeks.       Relevant Medications   methylPREDNISolone acetate (DEPO-MEDROL) injection 80 mg (Completed)   Other Relevant Orders   Ambulatory referral to Pulmonology      Outpatient Encounter Prescriptions as of 04/25/2015  Medication Sig    . ALPRAZolam (XANAX) 0.5 MG tablet Take 0.5 mg by mouth at bedtime as needed for anxiety.  . citalopram (CELEXA) 20 MG tablet Take 20 mg by mouth daily.  . furosemide (LASIX) 20 MG tablet Take 20 mg by mouth 2 (two) times daily as needed. swelling  . insulin detemir (LEVEMIR) 100 UNIT/ML injection Inject 22 Units into the skin at bedtime.  Marland Kitchen losartan (COZAAR) 50 MG tablet Take 50 mg by mouth daily.  . pantoprazole (PROTONIX) 40 MG tablet Take 1 tablet (40 mg total) by mouth daily.  . rosuvastatin (CRESTOR) 10 MG tablet Take 10 mg by mouth daily.  . sitaGLIPtin-metformin (JANUMET) 50-1000 MG per tablet Take 2 tablets by mouth once. Reported on 04/25/2015  . albuterol (PROVENTIL HFA;VENTOLIN HFA) 108 (90 Base) MCG/ACT inhaler Inhale 2 puffs into the lungs every 6 (six) hours as needed for wheezing or shortness of breath.  . benzonatate (TESSALON) 100 MG capsule Take 1 capsule (100 mg total) by mouth 3 (three) times daily as needed.  . chlorpheniramine-HYDROcodone (TUSSIONEX PENNKINETIC ER) 10-8 MG/5ML SUER Take 5 mLs by mouth every 12 (twelve) hours as needed.  . [DISCONTINUED] albuterol (PROVENTIL HFA;VENTOLIN HFA) 108 (90 BASE) MCG/ACT inhaler Inhale 2 puffs into the lungs every 6 (six) hours as needed. For shortness of breath or wheeze due to current condition.  . [DISCONTINUED] aspirin EC 81 MG tablet Take 81 mg by mouth daily.  . [DISCONTINUED] Exenatide ER (BYDUREON) 2 MG PEN Inject into the skin once a week. Every Sunday  . [DISCONTINUED] gemfibrozil (LOPID) 600 MG tablet Take 600 mg by mouth 2 (two) times daily before a meal.  . [DISCONTINUED] ibuprofen (ADVIL,MOTRIN) 800 MG tablet Take 800 mg by mouth 2 (two) times daily as needed. For pain  . [DISCONTINUED] Magnesium 500 MG CAPS Take 500 mg by mouth daily.  . [DISCONTINUED] nitroGLYCERIN (NITROSTAT) 0.4 MG SL tablet Place 1 tablet (0.4 mg total) under the tongue every 5 (five) minutes as needed for chest pain.  . [DISCONTINUED] quinapril  (ACCUPRIL) 20 MG tablet Take 20 mg by mouth at bedtime.  . [EXPIRED] methylPREDNISolone acetate (DEPO-MEDROL) injection 80 mg    No facility-administered encounter medications on file as of 04/25/2015.    Follow-up: 2 weeks.   [place]

## 2015-05-03 ENCOUNTER — Ambulatory Visit (INDEPENDENT_AMBULATORY_CARE_PROVIDER_SITE_OTHER): Payer: BLUE CROSS/BLUE SHIELD | Admitting: Family Medicine

## 2015-05-03 ENCOUNTER — Encounter: Payer: Self-pay | Admitting: Family Medicine

## 2015-05-03 VITALS — BP 132/76 | HR 76 | Temp 98.5°F | Ht 65.0 in | Wt 216.0 lb

## 2015-05-03 DIAGNOSIS — R053 Chronic cough: Secondary | ICD-10-CM

## 2015-05-03 DIAGNOSIS — R05 Cough: Secondary | ICD-10-CM | POA: Diagnosis not present

## 2015-05-03 NOTE — Assessment & Plan Note (Signed)
Established problem, worsening. Discussed workup: PFTs, referral to pulmonology, or impaired treatment with inhaled corticosteroids. After discussion, we elected to send her to pulmonology for further evaluation. Appointment scheduled for next week with Dr. Alva Garnet. Patient to continue PRN Albuterol and tussionex/tessalon.

## 2015-05-03 NOTE — Patient Instructions (Signed)
Continue current treatment.  Follow up to be scheduled after your Pulmonology appt.  Feel better and take care  Dr. Lacinda Axon

## 2015-05-03 NOTE — Progress Notes (Signed)
Subjective:  Patient ID: Michaela Tucker, female    DOB: 1955-12-01  Age: 60 y.o. MRN: [id number]  CC: Chronic cough  HPI:  60 year old female presents for follow up regarding chronic cough.  Chronic cough  Her previous visit on 3/14 she was given Tessalon and Tussionex. Also gave her steroid shot.  Since that time she's had some improvement with taking the tussionex. Tessalon is not helping. Brief response to albuterol.  Additionally, since her last visit she's had influenza and treated with Tamiflu.  She continues reports severe cough particularly during the day. No known exacerbating factors. She's had some relief as above.  Patient has not gotten her primary function tests due to illness.  She is here today to discuss treatment/work up options regarding her chronic cough.  No other complaints at this time.  Social Hx   Social History   Social History  . Marital Status: Widowed    Spouse Name: N/A  . Number of Children: N/A  . Years of Education: N/A   Social History Main Topics  . Smoking status: Never Smoker   . Smokeless tobacco: Never Used  . Alcohol Use: 1.2 oz/week    1 Glasses of wine, 1 Cans of beer per week     Comment: 09/27/2014 "glass of wine once/month; might have a beer once/month too"  . Drug Use: No  . Sexual Activity: Yes   Other Topics Concern  . None   Social History Narrative   Review of Systems  Constitutional: Negative.   Respiratory: Positive for cough.    Objective:  BP 132/76 mmHg  Pulse 76  Temp(Src) 98.5 F (36.9 C) (Oral)  Ht 5\' 5"  (1.651 m)  Wt 216 lb (97.977 kg)  BMI 35.94 kg/m2  BP/Weight 05/03/2015 04/25/2015 99991111  Systolic BP Q000111Q [id number]  Diastolic BP 76 78 70  Wt. (Lbs) 216 215.25 -  BMI 35.94 35.82 -   Physical Exam  Constitutional: She is oriented to person, place, and time. She appears well-developed. No distress.  HENT:  Head: Normocephalic and atraumatic.  Eyes: Conjunctivae are normal.    Cardiovascular: Normal rate and regular rhythm.   Pulmonary/Chest: Effort normal.  Wheezing noted.   Neurological: She is alert and oriented to person, place, and time.  Psychiatric: She has a normal mood and affect.  Vitals reviewed.  Lab Results  Component Value Date   WBC 8.1 09/26/2014   HGB 14.3 09/26/2014   HCT 39.4 09/26/2014   PLT 272 09/26/2014   GLUCOSE 291* 09/26/2014   CHOL 244* 07/29/2006   TRIG 336* 07/29/2006   HDL 30.9* 07/29/2006   LDLDIRECT 138.9 07/29/2006   ALT 28 04/09/2006   AST 26 04/09/2006   NA 135 09/26/2014   K 3.9 09/26/2014   CL 98* 09/26/2014   CREATININE 0.88 09/26/2014   BUN 12 09/26/2014   CO2 23 09/26/2014   HGBA1C 9.1* 09/26/2014   MICROALBUR 0.5 07/29/2006   Assessment & Plan:   Problem List Items Addressed This Visit    Chronic cough - Primary    Established problem, worsening. Discussed workup: PFTs, referral to pulmonology, or impaired treatment with inhaled corticosteroids. After discussion, we elected to send her to pulmonology for further evaluation. Appointment scheduled for next week with Dr. Alva Garnet. Patient to continue PRN Albuterol and tussionex/tessalon.      Relevant Orders   Ambulatory referral to Pulmonology     Follow-up: PRN  [place]

## 2015-05-09 ENCOUNTER — Encounter: Payer: Self-pay | Admitting: *Deleted

## 2015-05-09 ENCOUNTER — Ambulatory Visit: Payer: BLUE CROSS/BLUE SHIELD | Admitting: Pulmonary Disease

## 2015-06-08 ENCOUNTER — Telehealth: Payer: Self-pay | Admitting: Family Medicine

## 2015-06-08 ENCOUNTER — Other Ambulatory Visit: Payer: Self-pay | Admitting: Family Medicine

## 2015-06-08 MED ORDER — FLUCONAZOLE 150 MG PO TABS: 150.0000 mg | ORAL_TABLET | Freq: Once | ORAL | Status: DC

## 2015-06-08 NOTE — Telephone Encounter (Signed)
Rx sent 

## 2015-06-08 NOTE — Telephone Encounter (Signed)
Pt called back I advised pt that her Rx was sent to pharmacy. Pt was satisfied. Thank you!

## 2015-06-08 NOTE — Telephone Encounter (Signed)
Attempted to call patient to let her know that a prescription was sent in, unable to reach with the number provided.  Unable to leave a message.  Prescription was sent to the pharmacy. thanks

## 2015-06-08 NOTE — Telephone Encounter (Signed)
Pt called in about having a yeast infection due to her diabetes. Pt states every time she goes swimming she gets one. Pt was given medication for it by another provider so pt wants to know if Dr Lacinda Axon can prescribe her a Rx for a yeast infection? I advised pt she might have to come in but she wanted to see if he would write the prescription first. Pharmacy is [facility], [place]. Call pt @ [phone]. Thank you!

## 2015-06-08 NOTE — Telephone Encounter (Signed)
Please advise, does she need to be seen?

## 2015-06-16 ENCOUNTER — Ambulatory Visit: Payer: BLUE CROSS/BLUE SHIELD | Admitting: Family Medicine

## 2015-06-16 DIAGNOSIS — Z0289 Encounter for other administrative examinations: Secondary | ICD-10-CM

## 2015-08-14 ENCOUNTER — Telehealth: Payer: Self-pay | Admitting: *Deleted

## 2015-08-14 NOTE — Telephone Encounter (Signed)
Called pt, telephone services were off.

## 2015-08-14 NOTE — Telephone Encounter (Signed)
Dr. Lacinda Axon please advise if we can move to [facility] that day, patient was a no show for last appt. Thanks.

## 2015-08-14 NOTE — Telephone Encounter (Signed)
Please advise patient that Dr. Lacinda Axon would like her to follow up with him, if she needs refills, please let me know which ones and schedule First available with Dr. Lacinda Axon.  Thanks

## 2015-08-14 NOTE — Telephone Encounter (Signed)
Pt will be off from work on 08/18/15 and wanted to come in to see Dr. Lacinda Axon, he's not in the the office this day. Can Dr. Caryl Bis see this patient for a medication consults and diabetic check.

## 2015-08-14 NOTE — Telephone Encounter (Signed)
I would prefer she follow up with me as I am the PCP. Would not put on Eric's schedule.

## 2015-09-21 ENCOUNTER — Ambulatory Visit: Payer: BLUE CROSS/BLUE SHIELD | Admitting: Family Medicine

## 2015-09-25 ENCOUNTER — Encounter: Payer: Self-pay | Admitting: Family Medicine

## 2015-09-25 ENCOUNTER — Ambulatory Visit (INDEPENDENT_AMBULATORY_CARE_PROVIDER_SITE_OTHER): Payer: BLUE CROSS/BLUE SHIELD | Admitting: Family Medicine

## 2015-09-25 ENCOUNTER — Encounter (INDEPENDENT_AMBULATORY_CARE_PROVIDER_SITE_OTHER): Payer: Self-pay

## 2015-09-25 VITALS — BP 128/78 | HR 91 | Temp 98.6°F | Resp 18 | Wt 224.0 lb

## 2015-09-25 DIAGNOSIS — R300 Dysuria: Secondary | ICD-10-CM | POA: Diagnosis not present

## 2015-09-25 LAB — POCT URINALYSIS DIPSTICK
BILIRUBIN UA: NEGATIVE
Blood, UA: NEGATIVE
Ketones, UA: POSITIVE
LEUKOCYTES UA: NEGATIVE
NITRITE UA: NEGATIVE
Protein, UA: NEGATIVE
Spec Grav, UA: >=1.03
UROBILINOGEN UA: NEGATIVE
pH, UA: 6

## 2015-09-25 NOTE — Progress Notes (Signed)
Subjective:  Patient ID: Michaela Tucker, female    DOB: [birth year]  Age: 60 y.o. MRN: [id number]  CC: ? UTI  HPI:  60 year old female with DM 2, HTN, HLD presents with concerns for UTI.  Patient reports that last night she had some pain with urination.  Worsened this morning. No associated frequency. She does report some urinary urgency. No fevers or chills. No flank pain. No exacerbating or relieving factors. No other complaints at this time.  Social Hx   Social History   Social History  . Marital status: Widowed    Spouse name: N/A  . Number of children: N/A  . Years of education: N/A   Social History Main Topics  . Smoking status: Never Smoker  . Smokeless tobacco: Never Used  . Alcohol use 1.2 oz/week    1 Glasses of wine, 1 Cans of beer per week     Comment: 09/27/2014 "glass of wine once/month; might have a beer once/month too"  . Drug use: No  . Sexual activity: Yes   Other Topics Concern  . None   Social History Narrative  . None   Review of Systems  Constitutional: Negative.   Genitourinary: Positive for dysuria and urgency. Negative for flank pain and frequency.   Objective:  BP 128/78 (BP Location: Left Arm, Patient Position: Sitting, Cuff Size: Normal)   Pulse 91   Temp 98.6 F (37 C) (Oral)   Resp 18   Wt 224 lb (101.6 kg)   SpO2 97%   BMI 37.28 kg/m   BP/Weight 09/25/2015 05/03/2015 Q000111Q  Systolic BP 0000000 Q000111Q [id number]  Diastolic BP 78 76 78  Wt. (Lbs) 224 216 215.25  BMI 37.28 35.94 35.82   Physical Exam  Constitutional: She is oriented to person, place, and time. She appears well-developed. No distress.  Pulmonary/Chest: Effort normal.  Abdominal: Soft. She exhibits no distension. There is no tenderness. There is no rebound and no guarding.  Neurological: She is alert and oriented to person, place, and time.  Psychiatric: She has a normal mood and affect.  Vitals reviewed.  Lab Results  Component Value Date   WBC 8.1 09/26/2014   HGB  14.3 09/26/2014   HCT 39.4 09/26/2014   PLT 272 09/26/2014   GLUCOSE 291 (H) 09/26/2014   CHOL 244 (HH) 07/29/2006   TRIG 336 (HH) 07/29/2006   HDL 30.9 (L) 07/29/2006   LDLDIRECT 138.9 07/29/2006   ALT 28 04/09/2006   AST 26 04/09/2006   NA 135 09/26/2014   K 3.9 09/26/2014   CL 98 (L) 09/26/2014   CREATININE 0.88 09/26/2014   BUN 12 09/26/2014   CO2 23 09/26/2014   HGBA1C 9.1 (H) 09/26/2014   MICROALBUR 0.5 07/29/2006   Results for orders placed or performed in visit on 09/25/15 (from the past 24 hour(s))  POCT Urinalysis Dipstick     Status: Normal   Collection Time: 09/25/15  9:51 AM  Result Value Ref Range   Color, UA yellow    Clarity, UA clear    Glucose, UA 3+    Bilirubin, UA neg    Ketones, UA positive    Spec Grav, UA >=1.030    Blood, UA neg    pH, UA 6.0    Protein, UA neg    Urobilinogen, UA negative    Nitrite, UA neg    Leukocytes, UA Negative Negative    Assessment & Plan:   Problem List Items Addressed This Visit    Dysuria -  Primary    New acute problem. Acute, uncomplicated illness. Urinalysis with ketones and 3+ glucose. Otherwise unremarkable. No evidence of infection. Her dysuria is likely from vaginal atrophy and glucosuria. No need for antibiotics at this time. Sending for culture. Azo if needed.      Relevant Orders   POCT Urinalysis Dipstick (Completed)   Urine Culture    Other Visit Diagnoses   None.    Follow-up: PRN  [place]

## 2015-09-25 NOTE — Progress Notes (Signed)
Pre visit review using our clinic review tool, if applicable. No additional management support is needed unless otherwise documented below in the visit note. 

## 2015-09-25 NOTE — Assessment & Plan Note (Signed)
New acute problem. Acute, uncomplicated illness. Urinalysis with ketones and 3+ glucose. Otherwise unremarkable. No evidence of infection. Her dysuria is likely from vaginal atrophy and glucosuria. No need for antibiotics at this time. Sending for culture. Azo if needed.

## 2015-09-26 LAB — URINE CULTURE: Organism ID, Bacteria: 10000

## 2015-10-09 ENCOUNTER — Encounter: Payer: Self-pay | Admitting: Family Medicine

## 2015-10-09 ENCOUNTER — Ambulatory Visit (INDEPENDENT_AMBULATORY_CARE_PROVIDER_SITE_OTHER): Payer: BLUE CROSS/BLUE SHIELD | Admitting: Family Medicine

## 2015-10-09 ENCOUNTER — Ambulatory Visit (INDEPENDENT_AMBULATORY_CARE_PROVIDER_SITE_OTHER): Payer: BLUE CROSS/BLUE SHIELD

## 2015-10-09 VITALS — BP 133/84 | HR 95 | Temp 98.2°F | Wt 218.0 lb

## 2015-10-09 DIAGNOSIS — J988 Other specified respiratory disorders: Secondary | ICD-10-CM

## 2015-10-09 DIAGNOSIS — J189 Pneumonia, unspecified organism: Secondary | ICD-10-CM | POA: Insufficient documentation

## 2015-10-09 MED ORDER — HYDROCOD POLST-CPM POLST ER 10-8 MG/5ML PO SUER
5.0000 mL | Freq: Two times a day (BID) | ORAL | 0 refills | Status: DC | PRN
Start: 2015-10-09 — End: 2016-01-12

## 2015-10-09 MED ORDER — AMOXICILLIN-POT CLAVULANATE ER 1000-62.5 MG PO TB12: 2.0000 | ORAL_TABLET | Freq: Two times a day (BID) | ORAL | 0 refills | Status: DC

## 2015-10-09 MED ORDER — DOXYCYCLINE HYCLATE 100 MG PO TABS: 100.0000 mg | ORAL_TABLET | Freq: Two times a day (BID) | ORAL | 0 refills | Status: DC

## 2015-10-09 NOTE — Patient Instructions (Signed)
Use the cough medication as directed.  We will call with the xray results.  Rest, fluids.  Take care  Dr. Lacinda Axon

## 2015-10-09 NOTE — Assessment & Plan Note (Signed)
New acute problem. Likely viral respiratory infection. Given history, obtaining chest x-ray for further evaluation. Treating with Tussionex. May need antibiotics pending x-ray.

## 2015-10-09 NOTE — Addendum Note (Signed)
Addended by: Coral Spikes on: 10/09/2015 10:38 AM   Modules accepted: Orders

## 2015-10-09 NOTE — Assessment & Plan Note (Addendum)
New acute problem. Given past medical history, chest x-ray obtained today. Chest x-ray independently reviewed. Interpretation: Pulmonary congestion, and right upper lobe infiltrate. Treating with Augmentin + Doxy (can't use respiratory fluoroquinolone with Celexa used due to QTC prolongation). Needs to return for lab (BNP).

## 2015-10-09 NOTE — Progress Notes (Addendum)
Subjective:  Patient ID: Michaela Tucker, female    DOB: 09-28-1955  Age: 60 y.o. MRN: [id number]  CC: Cough, congestion  HPI:  60 year old female presents with the above complaints. This was initially supposed to be a follow-up visit but this was changed given her acute symptoms.  Patient reports that she has been experiencing congestion, cough, and wheezing/shortness of breath since Friday. She states that her symptoms initially started with congestion and have steadily worsened. Cough is productive. She's used albuterol without significant improvement. No associated fevers or chills. No known exacerbating factors. No reports of recent sick contacts. No other complaints this time.  Social Hx   Social History   Social History  . Marital status: Widowed    Spouse name: N/A  . Number of children: N/A  . Years of education: N/A   Social History Main Topics  . Smoking status: Never Smoker  . Smokeless tobacco: Never Used  . Alcohol use 1.2 oz/week    1 Glasses of wine, 1 Cans of beer per week     Comment: 09/27/2014 "glass of wine once/month; might have a beer once/month too"  . Drug use: No  . Sexual activity: Yes   Other Topics Concern  . None   Social History Narrative  . None   Review of Systems  Constitutional: Positive for fatigue.  HENT: Positive for congestion.   Respiratory: Positive for cough, shortness of breath and wheezing.    Objective:  BP 133/84 (BP Location: Right Arm, Patient Position: Sitting, Cuff Size: Large)   Pulse 95   Temp 98.2 F (36.8 C) (Oral)   Wt 218 lb (98.9 kg)   SpO2 92%   BMI 36.28 kg/m   BP/Weight 10/09/2015 09/25/2015 Q000111Q  Systolic BP Q000111Q 0000000 Q000111Q  Diastolic BP 84 78 76  Wt. (Lbs) 218 224 216  BMI 36.28 37.28 35.94   Physical Exam  Constitutional: She is oriented to person, place, and time.  Appears fatigued. NAD.   HENT:  Mouth/Throat: Oropharynx is clear and moist.  Eyes: Conjunctivae are normal.  Neck: Neck  supple.  Cardiovascular: Normal rate.   Pulmonary/Chest: Effort normal.  Decreased breath sounds throughout.   Neurological: She is alert and oriented to person, place, and time.  Psychiatric: She has a normal mood and affect.  Vitals reviewed.  Lab Results  Component Value Date   WBC 8.1 09/26/2014   HGB 14.3 09/26/2014   HCT 39.4 09/26/2014   PLT 272 09/26/2014   GLUCOSE 291 (H) 09/26/2014   CHOL 244 (HH) 07/29/2006   TRIG 336 (HH) 07/29/2006   HDL 30.9 (L) 07/29/2006   LDLDIRECT 138.9 07/29/2006   ALT 28 04/09/2006   AST 26 04/09/2006   NA 135 09/26/2014   K 3.9 09/26/2014   CL 98 (L) 09/26/2014   CREATININE 0.88 09/26/2014   BUN 12 09/26/2014   CO2 23 09/26/2014   HGBA1C 9.1 (H) 09/26/2014   MICROALBUR 0.5 07/29/2006   Assessment & Plan:   Problem List Items Addressed This Visit    CAP (community acquired pneumonia) - Primary    New acute problem. Given past medical history, chest x-ray obtained today. Chest x-ray independently reviewed. Interpretation: Pulmonary congestion, and right upper lobe infiltrate. Treating with Augmentin + Doxy (can't use respiratory fluoroquinolone with Celexa used due to QTC prolongation). Needs to return for lab (BNP).      Relevant Medications   chlorpheniramine-HYDROcodone (TUSSIONEX PENNKINETIC ER) 10-8 MG/5ML SUER   amoxicillin-clavulanate (AUGMENTIN XR) 1000-62.5 MG  12 hr tablet   doxycycline (VIBRA-TABS) 100 MG tablet   Other Relevant Orders   DG Chest 2 View (Completed)    Other Visit Diagnoses   None.     Meds ordered this encounter  Medications  . chlorpheniramine-HYDROcodone (TUSSIONEX PENNKINETIC ER) 10-8 MG/5ML SUER    Sig: Take 5 mLs by mouth every 12 (twelve) hours as needed.    Dispense:  115 mL    Refill:  0  . amoxicillin-clavulanate (AUGMENTIN XR) 1000-62.5 MG 12 hr tablet    Sig: Take 2 tablets by mouth 2 (two) times daily.    Dispense:  28 tablet    Refill:  0  . doxycycline (VIBRA-TABS) 100 MG tablet     Sig: Take 1 tablet (100 mg total) by mouth 2 (two) times daily.    Dispense:  14 tablet    Refill:  0   Follow-up: PRN  [place]

## 2015-10-11 ENCOUNTER — Telehealth: Payer: Self-pay

## 2015-10-11 NOTE — Telephone Encounter (Signed)
Pt coming for labs 10/12/15. Need future orders placed. Looks like possibly coming for a BNP.

## 2015-10-12 ENCOUNTER — Other Ambulatory Visit: Payer: Self-pay | Admitting: Family Medicine

## 2015-10-12 ENCOUNTER — Other Ambulatory Visit: Payer: BLUE CROSS/BLUE SHIELD

## 2015-10-12 DIAGNOSIS — I517 Cardiomegaly: Secondary | ICD-10-CM

## 2015-11-09 ENCOUNTER — Encounter: Payer: Self-pay | Admitting: Family Medicine

## 2015-11-09 ENCOUNTER — Ambulatory Visit (INDEPENDENT_AMBULATORY_CARE_PROVIDER_SITE_OTHER): Payer: BLUE CROSS/BLUE SHIELD | Admitting: Family Medicine

## 2015-11-09 ENCOUNTER — Encounter: Payer: Self-pay | Admitting: *Deleted

## 2015-11-09 ENCOUNTER — Ambulatory Visit (INDEPENDENT_AMBULATORY_CARE_PROVIDER_SITE_OTHER): Payer: BLUE CROSS/BLUE SHIELD

## 2015-11-09 VITALS — BP 130/72 | HR 90 | Temp 98.9°F | Wt 218.1 lb

## 2015-11-09 DIAGNOSIS — R059 Cough, unspecified: Secondary | ICD-10-CM

## 2015-11-09 DIAGNOSIS — J189 Pneumonia, unspecified organism: Secondary | ICD-10-CM

## 2015-11-09 DIAGNOSIS — R05 Cough: Secondary | ICD-10-CM | POA: Diagnosis not present

## 2015-11-09 NOTE — Progress Notes (Signed)
  Tommi Rumps, MD Phone: [phone]  Michaela Tucker is a 60 y.o. female who presents today for same-day visit.  Patient seen about a month ago and diagnosed with pneumonia. Treated with Augmentin and doxycycline. She notes she did improve following this. She notes more recently though she has started to feel tired and wants to sleep more. Started coughing again. Coughing up thick greenish yellow mucus. No fevers. No upper respiratory symptoms. No wheezing. No shortness of breath but does note occasionally feels hard to get a deep breath in. She is worried she may have pneumonia again.  ROS see history of present illness  Objective  Physical Exam Vitals:   11/09/15 1406  BP: 130/72  Pulse: 90  Temp: 98.9 F (37.2 C)    BP Readings from Last 3 Encounters:  11/09/15 130/72  10/09/15 133/84  09/25/15 128/78   Wt Readings from Last 3 Encounters:  11/09/15 218 lb 2 oz (98.9 kg)  10/09/15 218 lb (98.9 kg)  09/25/15 224 lb (101.6 kg)    Physical Exam  Constitutional: She is well-developed, well-nourished, and in no distress.  HENT:  Head: Normocephalic and atraumatic.  Mouth/Throat: Oropharynx is clear and moist. No oropharyngeal exudate.  Normal TMs bilaterally  Eyes: Conjunctivae are normal. Pupils are equal, round, and reactive to light.  Neck: Neck supple.  Cardiovascular: Normal rate, regular rhythm and normal heart sounds.   Pulmonary/Chest: Effort normal and breath sounds normal. No respiratory distress. She has no wheezes. She has no rales.  Lymphadenopathy:    She has no cervical adenopathy.  Neurological: She is alert. Gait normal.  Skin: Skin is warm and dry.     Assessment/Plan: Please see individual problem list.  CAP (community acquired pneumonia) Patient previously seen for this issue. Did have improvement in her symptoms with antibiotics. Has possibly had recurrence of some symptoms. Benign lung exam. Vital signs are stable. We'll repeat a chest  x-ray to follow-up on this. If there is an infiltrate we'll treat again. If no infiltrate we will treat supportively for viral illness. Return precautions in AVS.   Orders Placed This Encounter  Procedures  . DG Chest 2 View    Standing Status:   Future    Number of Occurrences:   1    Standing Expiration Date:   01/08/2017    Order Specific Question:   Reason for Exam (SYMPTOM  OR DIAGNOSIS REQUIRED)    Answer:   cough productive green mucus, recent history of pneumonia    Order Specific Question:   Is patient pregnant?    Answer:   No    Order Specific Question:   Preferred imaging location?    Answer:   [facility]    Tommi Rumps, MD [place]

## 2015-11-09 NOTE — Assessment & Plan Note (Addendum)
Patient previously seen for this issue. Did have improvement in her symptoms with antibiotics. Has possibly had recurrence of some symptoms. Benign lung exam. Vital signs are stable. We'll repeat a chest x-ray to follow-up on this. If there is an infiltrate we'll treat again. If no infiltrate we will treat supportively for viral illness. Return precautions in AVS.

## 2015-11-09 NOTE — Patient Instructions (Signed)
Nice to see you. We are going to repeat a chest x-ray to evaluate for recurrent pneumonia. Once this returns we'll let you know regarding antibiotics. You can take ibuprofen or Tylenol for any discomfort. If you develop any shortness of breath, cough productive of blood, or fevers please seek medical attention immediately.

## 2015-11-10 ENCOUNTER — Telehealth: Payer: Self-pay | Admitting: Family Medicine

## 2015-11-10 MED ORDER — PREDNISONE 10 MG PO TABS: ORAL_TABLET | ORAL | 0 refills | Status: DC

## 2015-11-10 NOTE — Telephone Encounter (Signed)
Patient notified of X-ray results and has related she would like to try the prednisone taper patient stated she is aware this make cause elevation in her CBG.

## 2015-11-10 NOTE — Telephone Encounter (Signed)
Pt called to get her results from the chest xray from yesterday. Please advise?  Call pt @ [phone]. Thank you!

## 2015-11-10 NOTE — Telephone Encounter (Signed)
Can you send in RX

## 2015-11-10 NOTE — Telephone Encounter (Signed)
Prescription sent to pharmacy. She should monitor blood sugars and if they're persistently over 300 on the prednisone she should call us.

## 2015-11-10 NOTE — Telephone Encounter (Signed)
Left message to call on voicemail  

## 2015-11-10 NOTE — Telephone Encounter (Signed)
Patient advised and verbalized and understanding

## 2015-12-13 ENCOUNTER — Other Ambulatory Visit: Payer: Self-pay | Admitting: Family Medicine

## 2015-12-13 NOTE — Telephone Encounter (Signed)
error 

## 2016-01-12 ENCOUNTER — Ambulatory Visit (INDEPENDENT_AMBULATORY_CARE_PROVIDER_SITE_OTHER): Payer: BLUE CROSS/BLUE SHIELD | Admitting: Family Medicine

## 2016-01-12 ENCOUNTER — Encounter: Payer: Self-pay | Admitting: Family Medicine

## 2016-01-12 DIAGNOSIS — B349 Viral infection, unspecified: Secondary | ICD-10-CM | POA: Diagnosis not present

## 2016-01-12 MED ORDER — PROMETHAZINE HCL 12.5 MG PO TABS: 12.5000 mg | ORAL_TABLET | Freq: Three times a day (TID) | ORAL | 0 refills | Status: DC | PRN

## 2016-01-12 NOTE — Patient Instructions (Signed)
Use the phenergan as needed for nausea/dizziness. It may make you drowsy.  Follow up as scheduled previously.  Take care  Dr. Lacinda Axon

## 2016-01-12 NOTE — Progress Notes (Signed)
Subjective:  Patient ID: Royetta Asal, female    DOB: March 12, 1955  Age: 60 y.o. MRN: [id number]  CC: Ear pain/popping, nausea, dizziness  HPI:  60 year old female presents for an acute visit with the above complaints.  Patient states that she recently developed a cold. She states that her symptoms improved but she subsequently developed year popping/pain. She states that her ears feel stopped up. She is also had recent nausea and dizziness as of this past Tuesday. No associated fever or chills. No known exacerbating or relieving factors. No other associated symptoms. No other complaints at this time.  Social Hx   Social History   Social History  . Marital status: Widowed    Spouse name: N/A  . Number of children: N/A  . Years of education: N/A   Social History Main Topics  . Smoking status: Never Smoker  . Smokeless tobacco: Never Used  . Alcohol use 1.2 oz/week    1 Glasses of wine, 1 Cans of beer per week     Comment: 09/27/2014 "glass of wine once/month; might have a beer once/month too"  . Drug use: No  . Sexual activity: Yes   Other Topics Concern  . None   Social History Narrative  . None   Review of Systems  Constitutional: Negative for fatigue.  HENT: Positive for ear pain.   Gastrointestinal: Positive for nausea.  Neurological: Positive for dizziness.   Objective:  BP 135/80 (BP Location: Left Arm, Patient Position: Sitting, Cuff Size: Large)   Pulse 86   Temp 98.3 F (36.8 C) (Oral)   Resp 14   Wt 213 lb (96.6 kg)   SpO2 96%   BMI 35.45 kg/m   BP/Weight 01/12/2016 11/09/2015 0000000  Systolic BP [id number] Q000111Q  Diastolic BP 80 72 84  Wt. (Lbs) 213 218.13 218  BMI 35.45 36.3 36.28   Physical Exam  Constitutional: She is oriented to person, place, and time. She appears well-developed. No distress.  HENT:  Mouth/Throat: Oropharynx is clear and moist.  Normal TMs bilaterally.  Cardiovascular: Normal rate and regular rhythm.   99991111 systolic  murmur.  Pulmonary/Chest: Effort normal and breath sounds normal.  Abdominal: Soft. She exhibits no distension. There is no tenderness.  Neurological: She is alert and oriented to person, place, and time.  Negative Dix-Hallpike.  Psychiatric: She has a normal mood and affect.  Vitals reviewed.  Lab Results  Component Value Date   WBC 8.1 09/26/2014   HGB 14.3 09/26/2014   HCT 39.4 09/26/2014   PLT 272 09/26/2014   GLUCOSE 291 (H) 09/26/2014   CHOL 244 (HH) 07/29/2006   TRIG 336 (HH) 07/29/2006   HDL 30.9 (L) 07/29/2006   LDLDIRECT 138.9 07/29/2006   ALT 28 04/09/2006   AST 26 04/09/2006   NA 135 09/26/2014   K 3.9 09/26/2014   CL 98 (L) 09/26/2014   CREATININE 0.88 09/26/2014   BUN 12 09/26/2014   CO2 23 09/26/2014   HGBA1C 9.1 (H) 09/26/2014   MICROALBUR 0.5 07/29/2006    Assessment & Plan:   Problem List Items Addressed This Visit    Viral illness    New problem. Exam unremarkable. I suspect that the patient has an underlying viral illness/viral syndrome. Treating with supportive care and Phenergan as needed.         Meds ordered this encounter  Medications  . promethazine (PHENERGAN) 12.5 MG tablet    Sig: Take 1 tablet (12.5 mg total) by mouth every 8 (eight)  hours as needed for nausea or vomiting.    Dispense:  20 tablet    Refill:  0    Follow-up: PRN  [place]

## 2016-01-12 NOTE — Assessment & Plan Note (Signed)
New problem. Exam unremarkable. I suspect that the patient has an underlying viral illness/viral syndrome. Treating with supportive care and Phenergan as needed.

## 2016-01-15 ENCOUNTER — Other Ambulatory Visit: Payer: Self-pay | Admitting: Family Medicine

## 2016-01-15 ENCOUNTER — Encounter: Payer: Self-pay | Admitting: Emergency Medicine

## 2016-01-15 ENCOUNTER — Telehealth: Payer: Self-pay | Admitting: Family Medicine

## 2016-01-15 ENCOUNTER — Emergency Department
Admission: EM | Admit: 2016-01-15 | Discharge: 2016-01-16 | Disposition: A | Discharge status: Home / Self Care | Payer: BLUE CROSS/BLUE SHIELD | Attending: Emergency Medicine | Admitting: Emergency Medicine

## 2016-01-15 DIAGNOSIS — E119 Type 2 diabetes mellitus without complications: Secondary | ICD-10-CM | POA: Insufficient documentation

## 2016-01-15 DIAGNOSIS — J45909 Unspecified asthma, uncomplicated: Secondary | ICD-10-CM | POA: Diagnosis not present

## 2016-01-15 DIAGNOSIS — R112 Nausea with vomiting, unspecified: Secondary | ICD-10-CM

## 2016-01-15 DIAGNOSIS — I10 Essential (primary) hypertension: Secondary | ICD-10-CM | POA: Diagnosis not present

## 2016-01-15 DIAGNOSIS — E860 Dehydration: Secondary | ICD-10-CM

## 2016-01-15 DIAGNOSIS — Z794 Long term (current) use of insulin: Secondary | ICD-10-CM | POA: Diagnosis not present

## 2016-01-15 DIAGNOSIS — Z85828 Personal history of other malignant neoplasm of skin: Secondary | ICD-10-CM | POA: Diagnosis not present

## 2016-01-15 DIAGNOSIS — R197 Diarrhea, unspecified: Secondary | ICD-10-CM | POA: Diagnosis present

## 2016-01-15 DIAGNOSIS — Z79899 Other long term (current) drug therapy: Secondary | ICD-10-CM | POA: Insufficient documentation

## 2016-01-15 DIAGNOSIS — R109 Unspecified abdominal pain: Secondary | ICD-10-CM

## 2016-01-15 LAB — CBC
HEMATOCRIT: 48.3 % — AB (ref 35.0–47.0)
HEMOGLOBIN: 16.6 g/dL — AB (ref 12.0–16.0)
MCH: 28.1 pg (ref 26.0–34.0)
MCHC: 34.3 g/dL (ref 32.0–36.0)
MCV: 82 fL (ref 80.0–100.0)
Platelets: 199 10*3/uL (ref 150–440)
RBC: 5.89 MIL/uL — ABNORMAL HIGH (ref 3.80–5.20)
RDW: 13.9 % (ref 11.5–14.5)
WBC: 10.9 10*3/uL (ref 3.6–11.0)

## 2016-01-15 LAB — URINALYSIS COMPLETE WITH MICROSCOPIC (ARMC ONLY)
BACTERIA UA: NONE SEEN
Bilirubin Urine: NEGATIVE
NITRITE: NEGATIVE
Protein, ur: 30 mg/dL — AB
SPECIFIC GRAVITY, URINE: 1.037 — AB (ref 1.005–1.030)
pH: 5 (ref 5.0–8.0)

## 2016-01-15 LAB — COMPREHENSIVE METABOLIC PANEL
ALT: 13 U/L — ABNORMAL LOW (ref 14–54)
ANION GAP: 13 (ref 5–15)
AST: 19 U/L (ref 15–41)
Albumin: 3.7 g/dL (ref 3.5–5.0)
Alkaline Phosphatase: 91 U/L (ref 38–126)
BILIRUBIN TOTAL: 1.6 mg/dL — AB (ref 0.3–1.2)
BUN: 15 mg/dL (ref 6–20)
CO2: 19 mmol/L — ABNORMAL LOW (ref 22–32)
Calcium: 8.8 mg/dL — ABNORMAL LOW (ref 8.9–10.3)
Chloride: 99 mmol/L — ABNORMAL LOW (ref 101–111)
Creatinine, Ser: 0.66 mg/dL (ref 0.44–1.00)
GFR calc Af Amer: >60 mL/min (ref >60)
Glucose, Bld: 347 mg/dL — ABNORMAL HIGH (ref 65–99)
POTASSIUM: 3.4 mmol/L — AB (ref 3.5–5.1)
Sodium: 131 mmol/L — ABNORMAL LOW (ref 135–145)
TOTAL PROTEIN: 7.4 g/dL (ref 6.5–8.1)

## 2016-01-15 LAB — LIPASE, BLOOD: Lipase: 13 U/L (ref 11–51)

## 2016-01-15 MED ORDER — SODIUM CHLORIDE 0.9 % IV BOLUS (SEPSIS)
1000.0000 mL | Freq: Once | INTRAVENOUS | Status: AC
  Administered 2016-01-15: 1000 mL via INTRAVENOUS

## 2016-01-15 MED ORDER — ONDANSETRON HCL 4 MG/2ML IJ SOLN
4.0000 mg | Freq: Once | INTRAMUSCULAR | Status: AC
  Administered 2016-01-15: 4 mg via INTRAVENOUS
  Filled 2016-01-15: qty 2

## 2016-01-15 MED ORDER — ONDANSETRON HCL 4 MG PO TABS: 4.0000 mg | ORAL_TABLET | Freq: Three times a day (TID) | ORAL | 0 refills | Status: DC | PRN

## 2016-01-15 MED ORDER — LOPERAMIDE HCL 2 MG PO CAPS
2.0000 mg | ORAL_CAPSULE | Freq: Once | ORAL | Status: DC
  Filled 2016-01-15: qty 1

## 2016-01-15 MED ORDER — SODIUM CHLORIDE 0.9 % IV SOLN
1000.0000 mL | Freq: Once | INTRAVENOUS | Status: AC
  Administered 2016-01-15: 1000 mL via INTRAVENOUS

## 2016-01-15 NOTE — Telephone Encounter (Signed)
Pt called and stated that she saw Dr. Lacinda Axon on Friday for dizziness. Pt stated that she is now having diarrhea and vomiting and she still states that she has some dizziness. Please advise, thank you!  Call pt @ [phone]

## 2016-01-15 NOTE — Telephone Encounter (Signed)
She can try zofran for nausea. If she feels that bad and is having trouble with continued diarrhea/weakness she should go to the hospital.

## 2016-01-15 NOTE — Telephone Encounter (Signed)
Spoke with patient states only vomited once last night, Phenergan help however makes her drowsy.  Still having nausea and diarrhea denies fever .  Dizziness not as bad.  Please advise.

## 2016-01-15 NOTE — ED Triage Notes (Signed)
Pt ambulatory to triage with c/o vomiting yesterday, pt reports vomiting subsided yesterday. Pt reports having diarrhea since Thursday. Pt was seen by PCP d/t nausea on Friday, was rx phenergan but has not relieved nausea. Pt alert and oriented x 4, no increased work in breathing noted.

## 2016-01-15 NOTE — Telephone Encounter (Signed)
Spoke with patient advised of below.  She states diarrhea hasn't eased up and  now having chills.  She will contact her daughter to take her to hospital as she doesn't feel safe to drive.

## 2016-01-15 NOTE — Telephone Encounter (Signed)
Pt called back to follow up she is feeling really bad. Please advise?   Call pt @ [phone]. Thank you!

## 2016-01-16 ENCOUNTER — Emergency Department: Payer: BLUE CROSS/BLUE SHIELD

## 2016-01-16 MED ORDER — ONDANSETRON 4 MG PO TBDP: 4.0000 mg | ORAL_TABLET | Freq: Three times a day (TID) | ORAL | 0 refills | Status: DC | PRN

## 2016-01-16 NOTE — ED Provider Notes (Signed)
[facility] Emergency Department Provider Note   ____________________________________________   First MD Initiated Contact with Patient 01/15/16 2316     (approximate)  I have reviewed the triage vital signs and the nursing notes.   HISTORY  Chief Complaint Emesis and Diarrhea    HPI Michaela Tucker is a 60 y.o. female who comes into the hospital today with some nausea. She reports that it started last week. She went to her doctor who gave her some Phenergan. The patient started having some diarrhea on Friday. She reports that she was vomiting on Sunday. The patient reports that she's had diarrhea about 10-12 times today.The patient reports it is watery with nothing solid in it. The patient has not taken anything over-the-counter. She reports that the vomiting has improved but she has continued having the diarrhea. She reports that she called her doctor's office and they told her to come in and get some fluids. She last ate around 5 PM yesterday. She has been drinking club soda. The patient denies any abdominal pain and has had some joint aches. The patient is here for evaluation today. Denies any fevers or pain with urination.   Past Medical History:  Diagnosis Date  . Anxiety   . Arthritis   . Asthma   . BBB (bundle branch block)   . Cancer ([place])    skin  . Chicken pox   . Chronic bronchitis ([place])   . Complication of anesthesia    "it's harder for me to wake up cause of my sleep apnea" (09/27/2014)  . Hypercholesterolemia   . OSA on CPAP   . Pneumonia   . Type II diabetes mellitus [facility])     Patient Active Problem List   Diagnosis Date Noted  . Viral illness 01/12/2016  . GERD (gastroesophageal reflux disease) 04/26/2015  . Chronic cough 04/26/2015  . Anxiety and depression 04/25/2015  . Diabetes mellitus without complication ([place]) [id number]  . Hyperlipidemia 09/26/2014  . Essential hypertension 07/25/2006    Past Surgical History:    Procedure Laterality Date  . ANTERIOR CERVICAL DECOMP/DISCECTOMY FUSION  12/2004  . BACK SURGERY    . CESAREAN SECTION  1995  . CYST EXCISION  1994    Prior to Admission medications   Medication Sig Start Date End Date Taking? Authorizing Provider  albuterol (PROVENTIL HFA;VENTOLIN HFA) 108 (90 Base) MCG/ACT inhaler Inhale 2 puffs into the lungs every 6 (six) hours as needed for wheezing or shortness of breath. 04/25/15  Yes Coral Spikes, DO  ALPRAZolam (XANAX) 0.5 MG tablet Take 0.5 mg by mouth at bedtime as needed for anxiety.   Yes Historical Provider, MD  citalopram (CELEXA) 20 MG tablet Take 20 mg by mouth daily.   Yes Historical Provider, MD  furosemide (LASIX) 20 MG tablet Take 20 mg by mouth 2 (two) times daily as needed. swelling   Yes Historical Provider, MD  insulin detemir (LEVEMIR) 100 UNIT/ML injection Inject 22 Units into the skin at bedtime.   Yes Historical Provider, MD  losartan (COZAAR) 50 MG tablet Take 50 mg by mouth daily.   Yes Historical Provider, MD  ondansetron (ZOFRAN) 4 MG tablet Take 1 tablet (4 mg total) by mouth every 8 (eight) hours as needed for nausea or vomiting. 01/15/16  Yes Jayce G Cook, DO  pantoprazole (PROTONIX) 40 MG tablet Take 1 tablet (40 mg total) by mouth daily. 09/27/14  Yes Ripudeep Krystal Eaton, MD  promethazine (PHENERGAN) 12.5 MG tablet Take 1 tablet (  12.5 mg total) by mouth every 8 (eight) hours as needed for nausea or vomiting. 01/12/16  Yes Coral Spikes, DO  rosuvastatin (CRESTOR) 10 MG tablet Take 10 mg by mouth daily.   Yes Historical Provider, MD  sitaGLIPtin-metformin (JANUMET) 50-1000 MG per tablet Take 2 tablets by mouth once. Reported on 04/25/2015   Yes Historical Provider, MD  ondansetron (ZOFRAN ODT) 4 MG disintegrating tablet Take 1 tablet (4 mg total) by mouth every 8 (eight) hours as needed for nausea or vomiting. 01/16/16   Loney Hering, MD    Allergies Codeine phosphate and Lovastatin  Family History  Problem Relation Age of  Onset  . Breast cancer Cousin 50    maternal side  . Lung cancer Mother   . Lung cancer Father   . Hyperlipidemia Father     Social History Social History  Substance Use Topics  . Smoking status: Never Smoker  . Smokeless tobacco: Never Used  . Alcohol use 1.2 oz/week    1 Glasses of wine, 1 Cans of beer per week     Comment: 09/27/2014 "glass of wine once/month; might have a beer once/month too"    Review of Systems Constitutional: No fever/chills Eyes: No visual changes. ENT: No sore throat. Cardiovascular: Denies chest pain. Respiratory: Denies shortness of breath. Gastrointestinal: Nausea, vomiting, diarrhea No abdominal pain.   Genitourinary: Negative for dysuria. Musculoskeletal: Negative for back pain. Skin: Negative for rash. Neurological: Negative for headaches, focal weakness or numbness.  10-point ROS otherwise negative.  ____________________________________________   PHYSICAL EXAM:  VITAL SIGNS: ED Triage Vitals  Enc Vitals Group     BP 01/15/16 2036 125/69     Pulse Rate 01/15/16 2036 (!) 110     Resp 01/15/16 2036 18     Temp 01/15/16 2036 99.5 F (37.5 C)     Temp Source 01/15/16 2036 Oral     SpO2 01/15/16 2036 95 %     Weight 01/15/16 2037 194 lb (88 kg)     Height 01/15/16 2037 5\' 4"  (1.626 m)     Head Circumference --      Peak Flow --      Pain Score 01/15/16 2037 6     Pain Loc --      Pain Edu? --      Excl. in [place]? --     Constitutional: Alert and oriented. Well appearing and in Mild distress. Eyes: Conjunctivae are normal. PERRL. EOMI. Head: Atraumatic. Nose: No congestion/rhinnorhea. Mouth/Throat: Mucous membranes are moist.  Oropharynx non-erythematous. Cardiovascular: Normal rate, regular rhythm. Grossly normal heart sounds.  Good peripheral circulation. Respiratory: Normal respiratory effort.  No retractions. Lungs CTAB. Gastrointestinal: Soft some right upper quadrant tenderness to palpation. No distention. Positive bowel  sounds Musculoskeletal: No lower extremity tenderness nor edema.   Neurologic:  Normal speech and language.  Skin:  Skin is warm, dry and intact.  Psychiatric: Mood and affect are normal.   ____________________________________________   LABS (all labs ordered are listed, but only abnormal results are displayed)  Labs Reviewed  COMPREHENSIVE METABOLIC PANEL - Abnormal; Notable for the following:       Result Value   Sodium 131 (*)    Potassium 3.4 (*)    Chloride 99 (*)    CO2 19 (*)    Glucose, Bld 347 (*)    Calcium 8.8 (*)    ALT 13 (*)    Total Bilirubin 1.6 (*)    All other components within normal limits  CBC - Abnormal; Notable for the following:    RBC 5.89 (*)    Hemoglobin 16.6 (*)    HCT 48.3 (*)    All other components within normal limits  URINALYSIS COMPLETEWITH MICROSCOPIC ([facility] ONLY) - Abnormal; Notable for the following:    Color, Urine YELLOW (*)    APPearance CLEAR (*)    Glucose, UA >500 (*)    Ketones, ur 2+ (*)    Specific Gravity, Urine 1.037 (*)    Hgb urine dipstick 1+ (*)    Protein, ur 30 (*)    Leukocytes, UA 1+ (*)    Squamous Epithelial / LPF 0-5 (*)    All other components within normal limits  URINE CULTURE  LIPASE, BLOOD   ____________________________________________  EKG  none ____________________________________________  RADIOLOGY  [place] RUQ ____________________________________________   PROCEDURES  Procedure(s) performed: None  Procedures  Critical Care performed: No  ____________________________________________   INITIAL IMPRESSION / ASSESSMENT AND PLAN / ED COURSE  Pertinent labs & imaging results that were available during my care of the patient were reviewed by me and considered in my medical decision making (see chart for details).  This is a 60 year old female who comes into the hospital today with some nausea vomiting and diarrhea. The patient reports that she still having the diarrhea and she is unsure how  to deal with it. I did give the patient 2 L of normal saline as well as some Zofran. She did have some pain to palpation on exam so I did send her for an ultrasound.  Clinical Course as of Jan 16 343  Tue Jan 16, 2016  0230 Normal liver, gallbladder and bile ducts. US Abdomen Limited RUQ [AW]    Clinical Course User Index [AW] Loney Hering, MD   Her pain is currently improved. She is drinking and eating crackers without any diarrhea or vomiting. The patient will be discharged home to follow-up with her primary care physician. The patient has no further complaints or concerns on discharge.  ____________________________________________   FINAL CLINICAL IMPRESSION(S) / ED DIAGNOSES  Final diagnoses:  Abdominal pain  Nausea vomiting and diarrhea  Dehydration      NEW MEDICATIONS STARTED DURING THIS VISIT:  New Prescriptions   ONDANSETRON (ZOFRAN ODT) 4 MG DISINTEGRATING TABLET    Take 1 tablet (4 mg total) by mouth every 8 (eight) hours as needed for nausea or vomiting.     Note:  This document was prepared using Dragon voice recognition software and may include unintentional dictation errors.    Loney Hering, MD 01/16/16 [phone]

## 2016-01-17 LAB — URINE CULTURE

## 2016-01-23 ENCOUNTER — Other Ambulatory Visit: Payer: Self-pay | Admitting: *Deleted

## 2016-01-23 NOTE — Telephone Encounter (Signed)
Patient requested a medication refill for levemir, janumet, rosuvastatin, pantoprazole and losartan  Pharmacy [facility]  Pt has a upcoming appt on 02/08/16

## 2016-01-23 NOTE — Telephone Encounter (Signed)
Pt does not have recent lipid or A1C labs. Pt last seen fpr CAP 11/09/15. Please advise?

## 2016-01-24 MED ORDER — INSULIN DETEMIR 100 UNIT/ML ~~LOC~~ SOLN: 22.0000 [IU] | Freq: Every day | SUBCUTANEOUS | 11 refills | Status: DC

## 2016-01-24 MED ORDER — SITAGLIPTIN PHOS-METFORMIN HCL 50-1000 MG PO TABS: 1.0000 | ORAL_TABLET | Freq: Two times a day (BID) | ORAL | 1 refills | Status: DC

## 2016-01-24 MED ORDER — ROSUVASTATIN CALCIUM 10 MG PO TABS: 10.0000 mg | ORAL_TABLET | Freq: Every day | ORAL | 3 refills | Status: DC

## 2016-01-24 MED ORDER — LOSARTAN POTASSIUM 50 MG PO TABS: 50.0000 mg | ORAL_TABLET | Freq: Every day | ORAL | 3 refills | Status: DC

## 2016-01-24 MED ORDER — PANTOPRAZOLE SODIUM 40 MG PO TBEC: 40.0000 mg | DELAYED_RELEASE_TABLET | Freq: Every day | ORAL | 1 refills | Status: DC

## 2016-01-24 NOTE — Telephone Encounter (Signed)
faxed

## 2016-01-26 ENCOUNTER — Telehealth: Payer: Self-pay | Admitting: Family Medicine

## 2016-01-26 MED ORDER — INSULIN DETEMIR 100 UNIT/ML FLEXPEN
PEN_INJECTOR | SUBCUTANEOUS | 2 refills | Status: DC
Start: 2016-01-26 — End: 2016-02-01

## 2016-01-26 NOTE — Telephone Encounter (Signed)
flexpen sent per pt request.

## 2016-01-26 NOTE — Telephone Encounter (Signed)
Pt called and stated that she needs Levemir injectable pen, what was sent in was a vial of insulin and she never has done that before. She needs the pen and needs to a box of 5 pens. It costs her too much for the insulin. Please advise, thank you!  [place], [place]  Call pt @ [phone] (may leave msg)

## 2016-02-01 ENCOUNTER — Telehealth: Payer: Self-pay | Admitting: Family Medicine

## 2016-02-01 ENCOUNTER — Other Ambulatory Visit: Payer: Self-pay | Admitting: Family Medicine

## 2016-02-01 MED ORDER — INSULIN DETEMIR 100 UNIT/ML FLEXPEN: PEN_INJECTOR | SUBCUTANEOUS | 2 refills | Status: DC

## 2016-02-01 NOTE — Telephone Encounter (Signed)
Pt was called in regards to medication. Please have her transferred to me.

## 2016-02-02 NOTE — Telephone Encounter (Signed)
Please call pt at 11:30 this will be her lunch Pt contact [phone]

## 2016-02-02 NOTE — Telephone Encounter (Signed)
Pt came in today due to pharmacy not feeling corrected medication. Pt asked for the rx to be sent to [facility]. A verbal was given for 30-days.

## 2016-02-08 ENCOUNTER — Encounter: Payer: Self-pay | Admitting: Family Medicine

## 2016-02-08 ENCOUNTER — Ambulatory Visit (INDEPENDENT_AMBULATORY_CARE_PROVIDER_SITE_OTHER): Payer: BLUE CROSS/BLUE SHIELD | Admitting: Family Medicine

## 2016-02-08 VITALS — BP 104/66 | HR 89 | Temp 97.5°F | Resp 14 | Wt 210.5 lb

## 2016-02-08 DIAGNOSIS — E119 Type 2 diabetes mellitus without complications: Secondary | ICD-10-CM

## 2016-02-08 DIAGNOSIS — E785 Hyperlipidemia, unspecified: Secondary | ICD-10-CM

## 2016-02-08 DIAGNOSIS — I10 Essential (primary) hypertension: Secondary | ICD-10-CM

## 2016-02-08 DIAGNOSIS — D582 Other hemoglobinopathies: Secondary | ICD-10-CM

## 2016-02-08 LAB — COMPREHENSIVE METABOLIC PANEL
ALK PHOS: 119 U/L — AB (ref 39–117)
ALT: 13 U/L (ref 0–35)
AST: 15 U/L (ref 0–37)
Albumin: 4.3 g/dL (ref 3.5–5.2)
BILIRUBIN TOTAL: 0.5 mg/dL (ref 0.2–1.2)
BUN: 14 mg/dL (ref 6–23)
CALCIUM: 9.5 mg/dL (ref 8.4–10.5)
CO2: 28 meq/L (ref 19–32)
Chloride: 98 mEq/L (ref 96–112)
Creatinine, Ser: 0.62 mg/dL (ref 0.40–1.20)
GFR: 104.4 mL/min (ref >60.00)
GLUCOSE: 326 mg/dL — AB (ref 70–99)
POTASSIUM: 4.1 meq/L (ref 3.5–5.1)
Sodium: 135 mEq/L (ref 135–145)
TOTAL PROTEIN: 7.5 g/dL (ref 6.0–8.3)

## 2016-02-08 LAB — CBC
HEMATOCRIT: 47 % — AB (ref 36.0–46.0)
HEMOGLOBIN: 16.1 g/dL — AB (ref 12.0–15.0)
MCHC: 34.3 g/dL (ref 30.0–36.0)
MCV: 83.4 fl (ref 78.0–100.0)
PLATELETS: 239 10*3/uL (ref 150.0–400.0)
RBC: 5.64 Mil/uL — ABNORMAL HIGH (ref 3.87–5.11)
RDW: 13.6 % (ref 11.5–15.5)
WBC: 9.5 10*3/uL (ref 4.0–10.5)

## 2016-02-08 LAB — LIPID PANEL
CHOLESTEROL: 179 mg/dL (ref 0–200)
HDL: 31.6 mg/dL — ABNORMAL LOW (ref >39.00)
Total CHOL/HDL Ratio: 6
Triglycerides: 626 mg/dL — ABNORMAL HIGH (ref 0.0–149.0)

## 2016-02-08 LAB — HEMOGLOBIN A1C: Hgb A1c MFr Bld: 12 % — ABNORMAL HIGH (ref 4.6–6.5)

## 2016-02-08 LAB — LDL CHOLESTEROL, DIRECT: LDL DIRECT: 76 mg/dL

## 2016-02-08 MED ORDER — INSULIN DETEMIR 100 UNIT/ML FLEXPEN: PEN_INJECTOR | SUBCUTANEOUS | 3 refills | Status: DC

## 2016-02-08 NOTE — Progress Notes (Signed)
Subjective:  Patient ID: Michaela Tucker, female    DOB: 1955-05-12  Age: 60 y.o. MRN: [id number]  CC: Follow up  HPI:  60 year old female with HTN, HLD, DM-2 presents for follow up.  DM-2   Uncontrolled.  Sugars have been elevated (200's).  Medications - Levemir 22 units BID, Janumet.  Endorses compliance.  Hypertension  At goal on Losartan.  HLD  Needs labs today.  Compliant with Crestor.  Social Hx   Social History   Social History  . Marital status: Widowed    Spouse name: N/A  . Number of children: N/A  . Years of education: N/A   Social History Main Topics  . Smoking status: Never Smoker  . Smokeless tobacco: Never Used  . Alcohol use 1.2 oz/week    1 Glasses of wine, 1 Cans of beer per week     Comment: 09/27/2014 "glass of wine once/month; might have a beer once/month too"  . Drug use: No  . Sexual activity: Yes   Other Topics Concern  . None   Social History Narrative  . None   Review of Systems  Constitutional: Negative.   Respiratory: Negative.   Cardiovascular: Negative.   All other systems reviewed and are negative.  Objective:  BP 104/66   Pulse 89   Temp 97.5 F (36.4 C) (Oral)   Resp 14   Wt 210 lb 8 oz (95.5 kg)   SpO2 96%   BMI 36.13 kg/m   BP/Weight 02/08/2016 01/16/2016 56/05/3327  Systolic BP 518 841 -  Diastolic BP 66 73 -  Wt. (Lbs) 210.5 - 194  BMI 36.13 - 33.3   Physical Exam  Constitutional: She is oriented to person, place, and time. She appears well-developed. No distress.  Cardiovascular: Normal rate and regular rhythm.   Pulmonary/Chest: Effort normal and breath sounds normal.  Neurological: She is alert and oriented to person, place, and time.  Psychiatric: She has a normal mood and affect.  Vitals reviewed.  Lab Results  Component Value Date   WBC 10.9 01/15/2016   HGB 16.6 (H) 01/15/2016   HCT 48.3 (H) 01/15/2016   PLT 199 01/15/2016   GLUCOSE 347 (H) 01/15/2016   CHOL 244 (HH) 07/29/2006   TRIG 336 (HH) 07/29/2006   HDL 30.9 (L) 07/29/2006   LDLDIRECT 138.9 07/29/2006   ALT 13 (L) 01/15/2016   AST 19 01/15/2016   NA 131 (L) 01/15/2016   K 3.4 (L) 01/15/2016   CL 99 (L) 01/15/2016   CREATININE 0.66 01/15/2016   BUN 15 01/15/2016   CO2 19 (L) 01/15/2016   HGBA1C 9.1 (H) 09/26/2014   MICROALBUR 0.5 07/29/2006    Assessment & Plan:   Problem List Items Addressed This Visit    Hyperlipidemia    Lipid panel today (has been elevated and not checked recently). Continue Crestor.      Relevant Orders   Lipid panel   Essential hypertension    At goal. Continue Losartan.      Relevant Orders   Comp Met (CMET)   Diabetes mellitus without complication ([place]) - Primary    Uncontrolled. A1C today. Increasing Levemir to 30 units BID.      Relevant Medications   Insulin Detemir (LEVEMIR) 100 UNIT/ML Pen   Other Relevant Orders   HgB A1c    Other Visit Diagnoses    Elevated hemoglobin (H[facility])       Relevant Orders   CBC     Meds ordered this encounter  Medications  . Insulin Detemir (LEVEMIR) 100 UNIT/ML Pen    Sig: 30 units twice daily.    Dispense:  5 pen    Refill:  3   Follow-up: Return in about 3 months (around 05/08/2016).  [place]

## 2016-02-08 NOTE — Patient Instructions (Addendum)
Increase the insulin to 30 units twice daily.   Follow up in 3 months.  Take care  Dr. Lacinda Axon

## 2016-02-08 NOTE — Assessment & Plan Note (Signed)
Lipid panel today (has been elevated and not checked recently). Continue Crestor.

## 2016-02-08 NOTE — Assessment & Plan Note (Signed)
At goal.  Continue Losartan  

## 2016-02-08 NOTE — Assessment & Plan Note (Signed)
Uncontrolled. A1C today. Increasing Levemir to 30 units BID.

## 2016-02-08 NOTE — Progress Notes (Signed)
Pre visit review using our clinic review tool, if applicable. No additional management support is needed unless otherwise documented below in the visit note. 

## 2016-02-09 ENCOUNTER — Other Ambulatory Visit: Payer: Self-pay | Admitting: Family Medicine

## 2016-02-09 DIAGNOSIS — Z1231 Encounter for screening mammogram for malignant neoplasm of breast: Secondary | ICD-10-CM

## 2016-02-13 ENCOUNTER — Other Ambulatory Visit: Payer: Self-pay | Admitting: Family Medicine

## 2016-02-13 ENCOUNTER — Telehealth: Payer: Self-pay | Admitting: *Deleted

## 2016-02-13 DIAGNOSIS — E119 Type 2 diabetes mellitus without complications: Secondary | ICD-10-CM

## 2016-02-13 NOTE — Telephone Encounter (Signed)
Pt called and given results 

## 2016-02-13 NOTE — Telephone Encounter (Signed)
Patient requested lab results  Contact [phone]

## 2016-02-14 ENCOUNTER — Telehealth: Payer: Self-pay | Admitting: Family Medicine

## 2016-02-14 ENCOUNTER — Other Ambulatory Visit: Payer: Self-pay | Admitting: Family Medicine

## 2016-02-14 ENCOUNTER — Ambulatory Visit
Admission: RE | Admit: 2016-02-14 | Discharge: 2016-02-14 | Disposition: A | Discharge status: Home / Self Care | Payer: BLUE CROSS/BLUE SHIELD | Source: Ambulatory Visit | Attending: Family Medicine | Admitting: Family Medicine

## 2016-02-14 DIAGNOSIS — Z1231 Encounter for screening mammogram for malignant neoplasm of breast: Secondary | ICD-10-CM

## 2016-02-14 MED ORDER — FLUCONAZOLE 150 MG PO TABS: 150.0000 mg | ORAL_TABLET | Freq: Once | ORAL | 0 refills | Status: DC

## 2016-02-14 NOTE — Telephone Encounter (Signed)
Rx sent 

## 2016-02-14 NOTE — Telephone Encounter (Signed)
Patient advised of below an verbalized an understanding

## 2016-02-14 NOTE — Telephone Encounter (Signed)
Pt called and stated that over the weekend she had gone in a heated pool and has no developed a yeast infection. Please advise, thank you!  Call pt @ [phone]  Pharmacy - [facility] [address] - [place], [place]

## 2016-02-14 NOTE — Telephone Encounter (Signed)
Reason for call: yeast infection,  Swam in heated pool this weekend , has diabetes and has history of frequent yeast infections Symptoms: discharge, itchy Duration 2 days Medications: none  Last seen for this problem:N/A  Seen by: Please advise

## 2016-02-16 ENCOUNTER — Ambulatory Visit (INDEPENDENT_AMBULATORY_CARE_PROVIDER_SITE_OTHER): Payer: BLUE CROSS/BLUE SHIELD | Admitting: Endocrinology

## 2016-02-16 ENCOUNTER — Encounter: Payer: Self-pay | Admitting: Endocrinology

## 2016-02-16 ENCOUNTER — Ambulatory Visit: Payer: BLUE CROSS/BLUE SHIELD | Admitting: Endocrinology

## 2016-02-16 VITALS — BP 118/62 | HR 98 | Ht 64.0 in | Wt 210.0 lb

## 2016-02-16 DIAGNOSIS — E119 Type 2 diabetes mellitus without complications: Secondary | ICD-10-CM

## 2016-02-16 LAB — HM DIABETES EYE EXAM

## 2016-02-16 MED ORDER — BAYER CONTOUR NEXT EZ W/DEVICE KIT: 1.0000 | PACK | Freq: Once | 0 refills | Status: AC

## 2016-02-16 MED ORDER — INSULIN DETEMIR 100 UNIT/ML FLEXPEN: 40.0000 [IU] | PEN_INJECTOR | Freq: Two times a day (BID) | SUBCUTANEOUS | 11 refills | Status: DC

## 2016-02-16 MED ORDER — GLUCOSE BLOOD VI STRP: 1.0000 | ORAL_STRIP | Freq: Two times a day (BID) | 12 refills | Status: AC

## 2016-02-16 NOTE — Patient Instructions (Addendum)
good diet and exercise significantly improve the control of your diabetes.  please let me know if you wish to be referred to a dietician.  high blood sugar is very risky to your health.  you should see an eye doctor and dentist every year.  It is very important to get all recommended vaccinations.   Controlling your blood pressure and cholesterol drastically reduces the damage diabetes does to your body.  Those who smoke should quit.  Please discuss these with your doctor.    check your blood sugar twice a day.  vary the time of day when you check, between before the 3 meals, and at bedtime.  also check if you have symptoms of your blood sugar being too high or too low.  please keep a record of the readings and bring it to your next appointment here (or you can bring the meter itself).  You can write it on any piece of paper.  please call us sooner if your blood sugar goes below 70, or if you have a lot of readings over 200.  For now, please increase the insulin to 40 units twice a day. Please call next week, to tell us how the blood sugar is doing.  Please come back for a follow-up appointment in 6 weeks.      Bariatric Surgery You have so much to gain by losing weight.  You may have already tried every diet and exercise plan imaginable.  And, you may have sought advice from your family physician, too.   Sometimes, in spite of such diligent efforts, you may not be able to achieve long-term results by yourself.  In cases of severe obesity, bariatric or weight loss surgery is a proven method of achieving long-term weight control.  Our Services Our bariatric surgery programs offer our patients new hope and long-term weight-loss solution.  Since introducing our services in 2003, we have conducted more than 2,400 successful procedures.  Our program is designated as a Programmer, multimedia by the Metabolic and Bariatric Surgery Accreditation and Quality Improvement Program (MBSAQIP), a Occupational hygienist that sets rigorous patient safety and outcome standards.  Our program is also designated as a Ecologist by [employer].   Our exceptional weight-loss surgery team specializes in diagnosis, treatment, follow-up care, and ongoing support for our patients with severe weight loss challenges.  We currently offer laparoscopic sleeve gastrectomy, gastric bypass, and adjustable gastric band (LAP-BAND).    Attend our [place] Choosing to undergo a bariatric procedure is a big decision, and one that should not be taken lightly.  You now have two options in how you learn about weight-loss surgery - in person or online.  Our objective is to ensure you have all of the information that you need to evaluate the advantages and obligations of this life changing procedure.  Please note that you are not alone in this process, and our experienced team is ready to assist and answer all of your questions.  There are several ways to register for a seminar (either on-line or in person): 1)  Call [phone] 2) Go on-line to [facility] and register for either type of seminar.  [email or web address]

## 2016-02-16 NOTE — Progress Notes (Signed)
Subjective:    Patient ID: Michaela Tucker, female    DOB: [birth year], 61 y.o.   MRN: [id number]  HPI pt is referred by Dr Lacinda Axon for diabetes.  Pt states DM was dx'ed in 2000 (she had GDM in 1998); she has mild if any neuropathy of the lower extremities; she is unaware of any associated chronic complications; she has been on insulin since early 2017; pt says her diet and exercise are not good; she has never had pancreatitis, severe hypoglycemia or DKA.  Last episode of severe hypoglycemia was in 2013.  She takes BID levemir, and janumet.  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 Past Medical History:  Diagnosis Date  . Anxiety   . Arthritis   . Asthma   . BBB (bundle branch block)   . Cancer ([place])    skin  . Chicken pox   . Chronic bronchitis ([place])   . Complication of anesthesia    "it's harder for me to wake up cause of my sleep apnea" (09/27/2014)  . Hypercholesterolemia   . OSA on CPAP   . Pneumonia   . Type II diabetes mellitus ([place])     Past Surgical History:  Procedure Laterality Date  . ANTERIOR CERVICAL DECOMP/DISCECTOMY FUSION  12/2004  . BACK SURGERY    . CESAREAN SECTION  1995  . CYST EXCISION  1994    Social History   Social History  . Marital status: Widowed    Spouse name: N/A  . Number of children: N/A  . Years of education: N/A   Occupational History  . Not on file.   Social History Main Topics  . Smoking status: Never Smoker  . Smokeless tobacco: Never Used  . Alcohol use 1.2 oz/week    1 Glasses of wine, 1 Cans of beer per week     Comment: 09/27/2014 "glass of wine once/month; might have a beer once/month too"  . Drug use: No  . Sexual activity: Yes   Other Topics Concern  . Not on file   Social History Narrative  . No narrative on file    Current Outpatient Prescriptions on File Prior to Visit  Medication Sig Dispense Refill  . albuterol (PROVENTIL HFA;VENTOLIN HFA) 108 (90 Base) MCG/ACT inhaler Inhale 2 puffs into the lungs every 6 (six)  hours as needed for wheezing or shortness of breath. 1 Inhaler 0  . ALPRAZolam (XANAX) 0.5 MG tablet Take 0.5 mg by mouth at bedtime as needed for anxiety.    . citalopram (CELEXA) 20 MG tablet Take 20 mg by mouth daily.    . furosemide (LASIX) 20 MG tablet Take 20 mg by mouth 2 (two) times daily as needed. swelling    . losartan (COZAAR) 50 MG tablet Take 1 tablet (50 mg total) by mouth daily. 90 tablet 3  . pantoprazole (PROTONIX) 40 MG tablet Take 1 tablet (40 mg total) by mouth daily. 90 tablet 1  . rosuvastatin (CRESTOR) 10 MG tablet Take 1 tablet (10 mg total) by mouth daily. 90 tablet 3  . sitaGLIPtin-metformin (JANUMET) 50-1000 MG tablet Take 1 tablet by mouth 2 (two) times daily with a meal. Reported on 04/25/2015 180 tablet 1   No current facility-administered medications on file prior to visit.     Allergies  Allergen Reactions  . Codeine Phosphate     REACTION: Nausea  . Lovastatin     REACTION: Muscle aches    Family History  Problem Relation Age of Onset  .  Breast cancer Cousin 50    maternal side  . Lung cancer Mother   . Lung cancer Father   . Hyperlipidemia Father   . Diabetes Father   . Diabetes Sister   . Diabetes Brother    BP 118/62   Pulse 98   Ht 5\' 4"  (1.626 m)   Wt 210 lb (95.3 kg)   SpO2 97%   BMI 36.05 kg/m   Review of Systems denies weight loss, blurry vision, headache, chest pain, sob, n/v, muscle cramps, excessive diaphoresis, depression, and rhinorrhea.  She has urinary frequency, easy bruising, and cold intolerance.     Objective:   Physical Exam VS: see vs page GEN: no distress HEAD: head: no deformity eyes: no periorbital swelling, no proptosis external nose and ears are normal mouth: no lesion seen NECK: supple, thyroid is not enlarged CHEST WALL: no deformity LUNGS: clear to auscultation CV: reg rate and rhythm, no murmur.  ABD: abdomen is soft, nontender.  no hepatosplenomegaly.  not distended.  no hernia.  MUSCULOSKELETAL:  muscle bulk and strength are grossly normal.  no obvious joint swelling.  gait is normal and steady.  EXTEMITIES: no deformity.  no ulcer on the feet.  feet are of normal color and temp.  Trace bilat leg edema.  PULSES: dorsalis pedis intact bilat.  no carotid bruit NEURO:  cn 2-12 grossly intact.   readily moves all 4's.  sensation is intact to touch on the feet SKIN:  Normal texture and temperature.  No rash or suspicious lesion is visible.   NODES:  None palpable at the neck PSYCH: alert, well-oriented.  Does not appear anxious nor depressed.   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  Lab Results  Component Value Date   CREATININE 0.62 02/08/2016   BUN 14 02/08/2016   NA 135 02/08/2016   K 4.1 02/08/2016   CL 98 02/08/2016   CO2 28 02/08/2016   Lab Results  Component Value Date   MICROALBUR 0.5 07/29/2006   I have reviewed outside records, and summarized: Pt was noted to have severely elevated a1c, and referred here.  She was noted to be compliant with medication,and insulin was increased.      Assessment & Plan:  Insulin-requiring type 2 DM: severe exacerbation.  I have sent a prescription to your pharmacy, for a meter and strips.   Patient is advised the following: Patient Instructions  good diet and exercise significantly improve the control of your diabetes.  please let me know if you wish to be referred to a dietician.  high blood sugar is very risky to your health.  you should see an eye doctor and dentist every year.  It is very important to get all recommended vaccinations.   Controlling your blood pressure and cholesterol drastically reduces the damage diabetes does to your body.  Those who smoke should quit.  Please discuss these with your doctor.    check your blood sugar twice a day.  vary  the time of day when you check, between before the 3 meals, and at bedtime.  also check if you have symptoms of your blood sugar being too high or too low.  please keep a record of the readings and bring it to your next appointment here (or you can bring the meter itself).  You can write it on any piece of  paper.  please call us sooner if your blood sugar goes below 70, or if you have a lot of readings over 200.  For now, please increase the insulin to 40 units twice a day. Please call next week, to tell us how the blood sugar is doing.  Please come back for a follow-up appointment in 6 weeks.      Bariatric Surgery You have so much to gain by losing weight.  You may have already tried every diet and exercise plan imaginable.  And, you may have sought advice from your family physician, too.   Sometimes, in spite of such diligent efforts, you may not be able to achieve long-term results by yourself.  In cases of severe obesity, bariatric or weight loss surgery is a proven method of achieving long-term weight control.  Our Services Our bariatric surgery programs offer our patients new hope and long-term weight-loss solution.  Since introducing our services in 2003, we have conducted more than 2,400 successful procedures.  Our program is designated as a Programmer, multimedia by the Metabolic and Bariatric Surgery Accreditation and Quality Improvement Program (MBSAQIP), a IT trainer that sets rigorous patient safety and outcome standards.  Our program is also designated as a Ecologist by [employer].   Our exceptional weight-loss surgery team specializes in diagnosis, treatment, follow-up care, and ongoing support for our patients with severe weight loss challenges.  We currently offer laparoscopic sleeve gastrectomy, gastric bypass, and adjustable gastric band (LAP-BAND).    Attend our [place] Choosing to undergo a bariatric procedure is a big decision,  and one that should not be taken lightly.  You now have two options in how you learn about weight-loss surgery - in person or online.  Our objective is to ensure you have all of the information that you need to evaluate the advantages and obligations of this life changing procedure.  Please note that you are not alone in this process, and our experienced team is ready to assist and answer all of your questions.  There are several ways to register for a seminar (either on-line or in person): 1)  Call [phone] 2) Go on-line to [facility] and register for either type of seminar.  [email or web address]

## 2016-02-20 ENCOUNTER — Telehealth: Payer: Self-pay | Admitting: Endocrinology

## 2016-02-20 NOTE — Telephone Encounter (Signed)
Patient stated dosage is wrong on her insulin,   Insulin Detemir (LEVEMIR) 100 UNIT/ML Pen  Please advise

## 2016-02-20 NOTE — Telephone Encounter (Signed)
I contacted the patient and advised on 02/16/2016 Dr. Loanne Drilling increase the Levemir to 40 units 2 times per day and this is the instructions we have on file. Patient advised to call back to further advise what the issue with with the Levemir is.

## 2016-02-22 MED ORDER — INSULIN DETEMIR 100 UNIT/ML FLEXPEN: 40.0000 [IU] | PEN_INJECTOR | Freq: Two times a day (BID) | SUBCUTANEOUS | 11 refills | Status: DC

## 2016-02-22 NOTE — Telephone Encounter (Signed)
Pt called in and said that Michaela Tucker is saying they did not receive the Levemir prescription that was sent in last Friday. She will be out soon and needs to make sure this can be resubmitted to the pharmacy.

## 2016-02-22 NOTE — Telephone Encounter (Signed)
Refill resubmitted.  

## 2016-02-23 ENCOUNTER — Ambulatory Visit (INDEPENDENT_AMBULATORY_CARE_PROVIDER_SITE_OTHER): Payer: BLUE CROSS/BLUE SHIELD | Admitting: Family Medicine

## 2016-02-23 ENCOUNTER — Encounter: Payer: Self-pay | Admitting: Family Medicine

## 2016-02-23 DIAGNOSIS — J069 Acute upper respiratory infection, unspecified: Secondary | ICD-10-CM | POA: Diagnosis not present

## 2016-02-23 DIAGNOSIS — B9789 Other viral agents as the cause of diseases classified elsewhere: Secondary | ICD-10-CM

## 2016-02-23 NOTE — Progress Notes (Signed)
Pre visit review using our clinic review tool, if applicable. No additional management support is needed unless otherwise documented below in the visit note. 

## 2016-02-23 NOTE — Patient Instructions (Signed)
This appears to be a viral illness.  Supportive care and OTC Tylenol as needed.  Take care  Dr. Lacinda Axon

## 2016-02-23 NOTE — Assessment & Plan Note (Signed)
No acute illness. Flu negative. No documented fever. Appears well. Normal exam. Advised supportive care and over-the-counter Tylenol/Motrin if needed.

## 2016-02-23 NOTE — Progress Notes (Signed)
   Subjective:  Patient ID: Michaela Tucker, female    DOB: 1955/06/01  Age: 61 y.o. MRN: [id number]  CC:  Fever, cough, body aches, ST  HPI:  61 year old female presents for an acute visit with the above complaints.  Patient reports she's been sick since Wednesday. Symptoms began with body aches and sore throat. Sore throat is now much improved. She continues to have body aches. She's had mild cough and ear discomfort as well. Most recent fever was last night. No documented temperature. No known exacerbating or relieving factors. No other associated symptoms. No other complaints or concerns at this time.  Social Hx   Social History   Social History  . Marital status: Widowed    Spouse name: N/A  . Number of children: N/A  . Years of education: N/A   Social History Main Topics  . Smoking status: Never Smoker  . Smokeless tobacco: Never Used  . Alcohol use 1.2 oz/week    1 Glasses of wine, 1 Cans of beer per week     Comment: 09/27/2014 "glass of wine once/month; might have a beer once/month too"  . Drug use: No  . Sexual activity: Yes   Other Topics Concern  . None   Social History Narrative  . None    Review of Systems  Constitutional: Positive for fever.  HENT: Positive for ear pain.   Respiratory: Positive for cough.   Musculoskeletal:       Body aches.   Objective:  BP 108/69   Pulse 89   Temp 98.7 F (37.1 C) (Oral)   Resp 14   Wt 212 lb 12.8 oz (96.5 kg)   SpO2 94%   BMI 36.53 kg/m   BP/Weight 02/23/2016 02/16/2016 123456  Systolic BP 123XX123 [id number] 123456  Diastolic BP 69 62 66  Wt. (Lbs) 212.8 210 210.5  BMI 36.53 36.05 36.13   Physical Exam  Constitutional: She is oriented to person, place, and time. She appears well-developed. No distress.  HENT:  Mouth/Throat: Oropharynx is clear and moist.  Normal TM's bilaterally.  Cardiovascular: Normal rate and regular rhythm.   2/6 systolic murmur.  Pulmonary/Chest: Effort normal. She has no wheezes. She has no  rales.  Neurological: She is alert and oriented to person, place, and time.  Psychiatric: She has a normal mood and affect.  Vitals reviewed.  Lab Results  Component Value Date   WBC 9.5 02/08/2016   HGB 16.1 (H) 02/08/2016   HCT 47.0 (H) 02/08/2016   PLT 239.0 02/08/2016   GLUCOSE 326 (H) 02/08/2016   CHOL 179 02/08/2016   TRIG (H) 02/08/2016    626.0 Triglyceride is over 400; calculations on Lipids are invalid.   HDL 31.60 (L) 02/08/2016   LDLDIRECT 76.0 02/08/2016   ALT 13 02/08/2016   AST 15 02/08/2016   NA 135 02/08/2016   K 4.1 02/08/2016   CL 98 02/08/2016   CREATININE 0.62 02/08/2016   BUN 14 02/08/2016   CO2 28 02/08/2016   HGBA1C 12.0 (H) 02/08/2016   MICROALBUR 0.5 07/29/2006   Assessment & Plan:   Problem List Items Addressed This Visit    Viral URI    No acute illness. Flu negative. No documented fever. Appears well. Normal exam. Advised supportive care and over-the-counter Tylenol/Motrin if needed.        Follow-up: PRN  [place]

## 2016-03-08 ENCOUNTER — Encounter: Payer: Self-pay | Admitting: *Deleted

## 2016-03-08 ENCOUNTER — Encounter: Payer: Self-pay | Admitting: Family Medicine

## 2016-03-08 ENCOUNTER — Ambulatory Visit (INDEPENDENT_AMBULATORY_CARE_PROVIDER_SITE_OTHER): Payer: BLUE CROSS/BLUE SHIELD | Admitting: Family Medicine

## 2016-03-08 VITALS — BP 110/62 | HR 84 | Temp 98.3°F | Ht 64.0 in | Wt 214.4 lb

## 2016-03-08 DIAGNOSIS — E119 Type 2 diabetes mellitus without complications: Secondary | ICD-10-CM

## 2016-03-08 DIAGNOSIS — L0292 Furuncle, unspecified: Secondary | ICD-10-CM | POA: Diagnosis not present

## 2016-03-08 MED ORDER — DOXYCYCLINE HYCLATE 100 MG PO CAPS: 100.0000 mg | ORAL_CAPSULE | Freq: Two times a day (BID) | ORAL | 0 refills | Status: DC

## 2016-03-08 NOTE — Progress Notes (Signed)
HPI:  Acute visit for boil on thigh. Has two for several days. Hx of same remotely. Some drainage. Mild pain. No fevers, malaise. Reports BS all under 200 recently and much better then in the past.  ROS: See pertinent positives and negatives per HPI.  Past Medical History:  Diagnosis Date  . Anxiety   . Arthritis   . Asthma   . BBB (bundle branch block)   . Cancer ([place])    skin  . Chicken pox   . Chronic bronchitis ([place])   . Complication of anesthesia    "it's harder for me to wake up cause of my sleep apnea" (09/27/2014)  . Hypercholesterolemia   . OSA on CPAP   . Pneumonia   . Type II diabetes mellitus ([place])     Past Surgical History:  Procedure Laterality Date  . ANTERIOR CERVICAL DECOMP/DISCECTOMY FUSION  12/2004  . BACK SURGERY    . CESAREAN SECTION  1995  . CYST EXCISION  1994    Family History  Problem Relation Age of Onset  . Breast cancer Cousin 50    maternal side  . Lung cancer Mother   . Lung cancer Father   . Hyperlipidemia Father   . Diabetes Father   . Diabetes Sister   . Diabetes Brother     Social History   Social History  . Marital status: Widowed    Spouse name: N/A  . Number of children: N/A  . Years of education: N/A   Social History Main Topics  . Smoking status: Never Smoker  . Smokeless tobacco: Never Used  . Alcohol use 1.2 oz/week    1 Glasses of wine, 1 Cans of beer per week     Comment: 09/27/2014 "glass of wine once/month; might have a beer once/month too"  . Drug use: No  . Sexual activity: Yes   Other Topics Concern  . None   Social History Narrative  . None     Current Outpatient Prescriptions:  .  albuterol (PROVENTIL HFA;VENTOLIN HFA) 108 (90 Base) MCG/ACT inhaler, Inhale 2 puffs into the lungs every 6 (six) hours as needed for wheezing or shortness of breath., Disp: 1 Inhaler, Rfl: 0 .  ALPRAZolam (XANAX) 0.5 MG tablet, Take 0.5 mg by mouth at bedtime as needed for anxiety., Disp: , Rfl:  .  aspirin EC 81 MG  tablet, Take 81 mg by mouth daily., Disp: , Rfl:  .  citalopram (CELEXA) 20 MG tablet, Take 20 mg by mouth daily., Disp: , Rfl:  .  furosemide (LASIX) 20 MG tablet, Take 20 mg by mouth 2 (two) times daily as needed. swelling, Disp: , Rfl:  .  glucose blood (BAYER CONTOUR NEXT TEST) test strip, 1 each by Other route 2 (two) times daily. And lancets 2/day, Disp: 100 each, Rfl: 12 .  Insulin Detemir (LEVEMIR) 100 UNIT/ML Pen, Inject 40 Units into the skin 2 (two) times daily., Disp: 10 pen, Rfl: 11 .  losartan (COZAAR) 50 MG tablet, Take 1 tablet (50 mg total) by mouth daily., Disp: 90 tablet, Rfl: 3 .  pantoprazole (PROTONIX) 40 MG tablet, Take 1 tablet (40 mg total) by mouth daily., Disp: 90 tablet, Rfl: 1 .  rosuvastatin (CRESTOR) 10 MG tablet, Take 1 tablet (10 mg total) by mouth daily., Disp: 90 tablet, Rfl: 3 .  sitaGLIPtin-metformin (JANUMET) 50-1000 MG tablet, Take 1 tablet by mouth 2 (two) times daily with a meal. Reported on 04/25/2015, Disp: 180 tablet, Rfl: 1 .  doxycycline (  VIBRAMYCIN) 100 MG capsule, Take 1 capsule (100 mg total) by mouth 2 (two) times daily., Disp: 14 capsule, Rfl: 0  EXAM:  Vitals:   03/08/16 1407  BP: 110/62  Pulse: 84  Temp: 98.3 F (36.8 C)    Body mass index is 36.8 kg/m.  GENERAL: vitals reviewed and listed above, alert, oriented, appears well hydrated and in no acute distress  SKIN: 1 small sub centimeter erythematous papule L thigh without fluctuance; draining small boil R external labia maj with small area surrounding erythema approx 2 cm in diameter  MS: moves all extremities without noticeable abnormality  PSYCH: pleasant and cooperative, no obvious depression or anxiety  ASSESSMENT AND PLAN:  Discussed the following assessment and plan:  Boils  Diabetes mellitus without complication ([place])  -doxy after discussion risks/benefits for draining boil with mild cellulitis -no abscess to drain -compresses -follow up next week with PCP if  persists -Patient advised to return or notify a doctor immediately if symptoms worsen or new concerns arise.  Patient Instructions  Start the antibiotic today.  Warm compresses.  Seek care immediately if worsening redness or swelling, fevers, malaise or other concerns.  I hope you feel better soon!   Colin Benton R., DO

## 2016-03-08 NOTE — Patient Instructions (Signed)
Start the antibiotic today.  Warm compresses.  Seek care immediately if worsening redness or swelling, fevers, malaise or other concerns.  I hope you feel better soon!

## 2016-03-08 NOTE — Progress Notes (Signed)
Pre visit review using our clinic review tool, if applicable. No additional management support is needed unless otherwise documented below in the visit note. 

## 2016-03-09 ENCOUNTER — Other Ambulatory Visit: Payer: Self-pay | Admitting: Family Medicine

## 2016-03-11 NOTE — Telephone Encounter (Signed)
On list of medications. Please advise?

## 2016-03-29 ENCOUNTER — Ambulatory Visit: Payer: BLUE CROSS/BLUE SHIELD | Admitting: Endocrinology

## 2016-03-29 ENCOUNTER — Ambulatory Visit (INDEPENDENT_AMBULATORY_CARE_PROVIDER_SITE_OTHER): Payer: BLUE CROSS/BLUE SHIELD | Admitting: Family Medicine

## 2016-03-29 ENCOUNTER — Encounter: Payer: Self-pay | Admitting: Family Medicine

## 2016-03-29 VITALS — BP 100/64 | HR 91 | Temp 98.5°F | Wt 216.4 lb

## 2016-03-29 DIAGNOSIS — R1013 Epigastric pain: Secondary | ICD-10-CM

## 2016-03-29 DIAGNOSIS — J069 Acute upper respiratory infection, unspecified: Secondary | ICD-10-CM

## 2016-03-29 DIAGNOSIS — B9789 Other viral agents as the cause of diseases classified elsewhere: Secondary | ICD-10-CM | POA: Diagnosis not present

## 2016-03-29 LAB — COMPREHENSIVE METABOLIC PANEL
ALBUMIN: 4.1 g/dL (ref 3.6–5.1)
ALT: 14 U/L (ref 6–29)
AST: 20 U/L (ref 10–35)
Alkaline Phosphatase: 101 U/L (ref 33–130)
BILIRUBIN TOTAL: 0.5 mg/dL (ref 0.2–1.2)
BUN: 10 mg/dL (ref 7–25)
CALCIUM: 9.2 mg/dL (ref 8.6–10.4)
CO2: 22 mmol/L (ref 20–31)
Chloride: 98 mmol/L (ref 98–110)
Creat: 0.64 mg/dL (ref 0.50–0.99)
Glucose, Bld: 413 mg/dL — ABNORMAL HIGH (ref 65–99)
Potassium: 3.8 mmol/L (ref 3.5–5.3)
Sodium: 133 mmol/L — ABNORMAL LOW (ref 135–146)
TOTAL PROTEIN: 7.1 g/dL (ref 6.1–8.1)

## 2016-03-29 LAB — LIPASE: LIPASE: 17 U/L (ref 7–60)

## 2016-03-29 MED ORDER — HYDROCOD POLST-CPM POLST ER 10-8 MG/5ML PO SUER: 5.0000 mL | Freq: Two times a day (BID) | ORAL | 0 refills | Status: DC | PRN

## 2016-03-29 NOTE — Progress Notes (Signed)
Pre visit review using our clinic review tool, if applicable. No additional management support is needed unless otherwise documented below in the visit note. 

## 2016-03-29 NOTE — Progress Notes (Signed)
  Tommi Rumps, MD Phone: [phone]  Michaela Tucker is a 61 y.o. female who presents today for same-day visit.  Patient notes 3 days of sore throat and cough with some postnasal drip. Notes cough is mildly productive. No fevers. No shortness of breath. Does note some nausea earlier today that has gone away. Has not had any body aches. No vomiting or diarrhea. Had some mild epigastric discomfort as well earlier. None at this time. No ear discomfort.   ROS see history of present illness  Objective  Physical Exam Vitals:   03/29/16 1537  BP: 100/64  Pulse: 91  Temp: 98.5 F (36.9 C)    BP Readings from Last 3 Encounters:  03/29/16 100/64  03/08/16 110/62  02/23/16 108/69   Wt Readings from Last 3 Encounters:  03/29/16 216 lb 6.4 oz (98.2 kg)  03/08/16 214 lb 6.4 oz (97.3 kg)  02/23/16 212 lb 12.8 oz (96.5 kg)    Physical Exam  Constitutional: No distress.  HENT:  Head: Normocephalic and atraumatic.  Mouth/Throat: Oropharynx is clear and moist. No oropharyngeal exudate.  Normal TMs bilaterally  Eyes: Conjunctivae are normal. Pupils are equal, round, and reactive to light.  Neck: Neck supple.  Cardiovascular: Normal rate, regular rhythm and normal heart sounds.   Pulmonary/Chest: Effort normal and breath sounds normal.  Abdominal: Soft. Bowel sounds are normal. She exhibits no distension. There is no tenderness. There is no rebound and no guarding.  Lymphadenopathy:    She has no cervical adenopathy.  Neurological: She is alert. Gait normal.  Skin: Skin is warm and dry. She is not diaphoretic.     Assessment/Plan: Please see individual problem list.  Viral URI Patient is well-appearing. Benign exam. Likely viral illness. Unlikely flu given overall presentation and lack of fevers. Benign abdominal exam though given epigastric discomfort earlier today we'll check a CMP and lipase. Discussed supportive care with Flonase and Claritin. Tussionex for cough. Given  return precautions.   Orders Placed This Encounter  Procedures  . Comp Met (CMET)  . Lipase    Meds ordered this encounter  Medications  . chlorpheniramine-HYDROcodone (TUSSIONEX PENNKINETIC ER) 10-8 MG/5ML SUER    Sig: Take 5 mLs by mouth every 12 (twelve) hours as needed for cough.    Dispense:  140 mL    Refill:  0    Tommi Rumps, MD [place]

## 2016-03-29 NOTE — Assessment & Plan Note (Addendum)
Patient is well-appearing. Benign exam. Likely viral illness. Unlikely flu given overall presentation and lack of fevers. Benign abdominal exam though given epigastric discomfort earlier today we'll check a CMP and lipase. Discussed supportive care with Flonase and Claritin. Tussionex for cough. Given return precautions.

## 2016-03-29 NOTE — Patient Instructions (Signed)
Nice to meet you. We will check some lab work. You can use the Tussionex for cough. You can also take Claritin and Flonase to help with your symptoms. If you develop trouble breathing, abdominal pain, blood in your stool, fevers, or any new or changing symptoms please seek medical attention medially.

## 2016-03-30 ENCOUNTER — Telehealth: Payer: Self-pay | Admitting: Family Medicine

## 2016-03-30 NOTE — Telephone Encounter (Signed)
Spoke with patient regarding results. She reports that she did not take her insulin yesterday morning prior to her office visit as she had not eaten. She then ate once leaving the office and rechecked her sugar and it was <200. She does report difficulty with her sugars and they typically run in the 260 range fasting. Discussed that she needs to monitor her sugars more closely and if the start to run higher than typical needs to let us or her endocrinologist know. She has follow-up scheduled with endocrinology early next month. Advised of other normal lab results.

## 2016-04-09 ENCOUNTER — Other Ambulatory Visit: Payer: Self-pay

## 2016-04-09 NOTE — Telephone Encounter (Signed)
Does not look like this medication has ever been filled by you. Pt's last office visit was 02/08/2016. Next office visit is scheduled for 05/09/2016. Please advise.

## 2016-04-10 MED ORDER — CITALOPRAM HYDROBROMIDE 20 MG PO TABS: 20.0000 mg | ORAL_TABLET | Freq: Every day | ORAL | 1 refills | Status: DC

## 2016-04-12 ENCOUNTER — Ambulatory Visit (INDEPENDENT_AMBULATORY_CARE_PROVIDER_SITE_OTHER): Payer: BLUE CROSS/BLUE SHIELD | Admitting: Endocrinology

## 2016-04-12 ENCOUNTER — Encounter: Payer: Self-pay | Admitting: Endocrinology

## 2016-04-12 VITALS — BP 132/84 | HR 88 | Ht 64.0 in | Wt 217.0 lb

## 2016-04-12 DIAGNOSIS — E119 Type 2 diabetes mellitus without complications: Secondary | ICD-10-CM | POA: Diagnosis not present

## 2016-04-12 LAB — POCT GLYCOSYLATED HEMOGLOBIN (HGB A1C): Hemoglobin A1C: 12.2

## 2016-04-12 MED ORDER — INSULIN DETEMIR 100 UNIT/ML FLEXPEN: 40.0000 [IU] | PEN_INJECTOR | Freq: Every day | SUBCUTANEOUS | 11 refills | Status: DC

## 2016-04-12 MED ORDER — INSULIN ASPART 100 UNIT/ML FLEXPEN: PEN_INJECTOR | SUBCUTANEOUS | 11 refills | Status: DC

## 2016-04-12 NOTE — Progress Notes (Signed)
Subjective:    Patient ID: Michaela Tucker, female    DOB: 22-Jul-1955, 61 y.o.   MRN: [id number]  HPI Pt returns for f/u of diabetes mellitus: DM type: Insulin-requiring type 2 Dx'ed: [id number] Complications: none Therapy: insulin since 2017 GDM: 1998 DKA: never Severe hypoglycemia: last episode was 2013.   Pancreatitis: never.   Other: she is on BID insulin, at least for now.  Interval history: no cbg record, but states cbg's vary from 150-300.  pt states she feels well in general.  Past Medical History:  Diagnosis Date  . Anxiety   . Arthritis   . Asthma   . BBB (bundle branch block)   . Cancer ([place])    skin  . Chicken pox   . Chronic bronchitis ([place])   . Complication of anesthesia    "it's harder for me to wake up cause of my sleep apnea" (09/27/2014)  . Hypercholesterolemia   . OSA on CPAP   . Pneumonia   . Type II diabetes mellitus ([place])     Past Surgical History:  Procedure Laterality Date  . ANTERIOR CERVICAL DECOMP/DISCECTOMY FUSION  12/2004  . BACK SURGERY    . CESAREAN SECTION  1995  . CYST EXCISION  1994    Social History   Social History  . Marital status: Widowed    Spouse name: N/A  . Number of children: N/A  . Years of education: N/A   Occupational History  . Not on file.   Social History Main Topics  . Smoking status: Never Smoker  . Smokeless tobacco: Never Used  . Alcohol use 1.2 oz/week    1 Glasses of wine, 1 Cans of beer per week     Comment: 09/27/2014 "glass of wine once/month; might have a beer once/month too"  . Drug use: No  . Sexual activity: Yes   Other Topics Concern  . Not on file   Social History Narrative  . No narrative on file    Current Outpatient Prescriptions on File Prior to Visit  Medication Sig Dispense Refill  . albuterol (PROVENTIL HFA;VENTOLIN HFA) 108 (90 Base) MCG/ACT inhaler Inhale 2 puffs into the lungs every 6 (six) hours as needed for wheezing or shortness of breath. 1 Inhaler 0  . ALPRAZolam (XANAX)  0.5 MG tablet Take 0.5 mg by mouth at bedtime as needed for anxiety.    Marland Kitchen aspirin EC 81 MG tablet Take 81 mg by mouth daily.    . chlorpheniramine-HYDROcodone (TUSSIONEX PENNKINETIC ER) 10-8 MG/5ML SUER Take 5 mLs by mouth every 12 (twelve) hours as needed for cough. 140 mL 0  . citalopram (CELEXA) 20 MG tablet Take 1 tablet (20 mg total) by mouth daily. 90 tablet 1  . furosemide (LASIX) 20 MG tablet Take 20 mg by mouth 2 (two) times daily as needed. swelling    . glucose blood (BAYER CONTOUR NEXT TEST) test strip 1 each by Other route 2 (two) times daily. And lancets 2/day 100 each 12  . losartan (COZAAR) 50 MG tablet Take 1 tablet (50 mg total) by mouth daily. 90 tablet 3  . pantoprazole (PROTONIX) 40 MG tablet Take 1 tablet (40 mg total) by mouth daily. 90 tablet 1  . rosuvastatin (CRESTOR) 10 MG tablet Take 1 tablet (10 mg total) by mouth daily. 90 tablet 3  . sitaGLIPtin-metformin (JANUMET) 50-1000 MG tablet Take 1 tablet by mouth 2 (two) times daily with a meal. Reported on 04/25/2015 180 tablet 1   No current facility-administered  medications on file prior to visit.     Allergies  Allergen Reactions  . Codeine Phosphate     REACTION: Nausea  . Lovastatin     REACTION: Muscle aches    Family History  Problem Relation Age of Onset  . Breast cancer Cousin 50    maternal side  . Lung cancer Mother   . Lung cancer Father   . Hyperlipidemia Father   . Diabetes Father   . Diabetes Sister   . Diabetes Brother     BP 132/84   Pulse 88   Ht 5\' 4"  (1.626 m)   Wt 217 lb (98.4 kg)   SpO2 95%   BMI 37.25 kg/m    Review of Systems She denies hypoglycemia.      Objective:   Physical Exam VITAL SIGNS:  See vs page GENERAL: no distress Pulses: dorsalis pedis intact bilat.   MSK: no deformity of the feet CV: no leg edema Skin:  no ulcer on the feet.  normal color and temp on the feet. Neuro: sensation is intact to touch on the feet.    Lab Results  Component Value Date    HGBA1C 12.2 04/12/2016      Assessment & Plan:  Insulin-requiring type 2 DM: we'll have a trial of multiple daily injections, but she may need QD insulin.   Patient is advised the following: Patient Instructions  For now, please:  Reduce the levemir to 40 units just at bedtime, and:  Take novolog, 3 times a day (just before each meal) 11-21-18 units.  check your blood sugar twice a day.  vary the time of day when you check, between before the 3 meals, and at bedtime.  also check if you have symptoms of your blood sugar being too high or too low.  please keep a record of the readings and bring it to your next appointment here (or you can bring the meter itself).  You can write it on any piece of paper.  please call us sooner if your blood sugar goes below 70, or if you have a lot of readings over 200.   Please come back for a follow-up appointment in 1 month.

## 2016-04-12 NOTE — Patient Instructions (Signed)
For now, please:  Reduce the levemir to 40 units just at bedtime, and:  Take novolog, 3 times a day (just before each meal) 11-21-18 units.  check your blood sugar twice a day.  vary the time of day when you check, between before the 3 meals, and at bedtime.  also check if you have symptoms of your blood sugar being too high or too low.  please keep a record of the readings and bring it to your next appointment here (or you can bring the meter itself).  You can write it on any piece of paper.  please call us sooner if your blood sugar goes below 70, or if you have a lot of readings over 200.   Please come back for a follow-up appointment in 1 month.

## 2016-04-29 ENCOUNTER — Ambulatory Visit (INDEPENDENT_AMBULATORY_CARE_PROVIDER_SITE_OTHER): Payer: BLUE CROSS/BLUE SHIELD | Admitting: Family Medicine

## 2016-04-29 ENCOUNTER — Encounter: Payer: Self-pay | Admitting: Family Medicine

## 2016-04-29 ENCOUNTER — Telehealth: Payer: Self-pay | Admitting: Family Medicine

## 2016-04-29 DIAGNOSIS — R053 Chronic cough: Secondary | ICD-10-CM

## 2016-04-29 DIAGNOSIS — R05 Cough: Secondary | ICD-10-CM

## 2016-04-29 MED ORDER — HYDROCOD POLST-CPM POLST ER 10-8 MG/5ML PO SUER: 5.0000 mL | Freq: Two times a day (BID) | ORAL | 0 refills | Status: DC | PRN

## 2016-04-29 MED ORDER — FLUTICASONE FUROATE-VILANTEROL 100-25 MCG/INH IN AEPB: 1.0000 | INHALATION_SPRAY | Freq: Every day | RESPIRATORY_TRACT | 0 refills | Status: AC

## 2016-04-29 NOTE — Patient Instructions (Signed)
Breo once daily.  Tussionex as needed.  We will arrange referral.  Take care  Dr. Lacinda Axon

## 2016-04-29 NOTE — Assessment & Plan Note (Signed)
Established problem, worsening. Referring to pulmonology. [facility] Tussionex as needed.

## 2016-04-29 NOTE — Progress Notes (Signed)
   Subjective:  Patient ID: Michaela Tucker, female    DOB: [birth year]  Age: 61 y.o. MRN: [id number]  CC: Cough  HPI:  61 year old female with hypertension, uncontrolled DM 2, hyperlipidemia, chronic cough presents with complaints of cough.  Patient states she's been sick since last Tuesday. She had URI symptoms and then developed a productive cough. She has had several bouts of pneumonia as well as bronchitis since I have seen her. I recommended pulmonology referral previously and she did not proceed with this as she got better temporarily. Patient reports severe cough. Continues to persist. She was seen in the walk-in clinic on Wednesday and was prescribed azithromycin, prednisone, and cough medication. She took the azithromycin has 1 day left. She took one dose of prednisone and then discontinued. She states that it made her feel crazy. Cough medication made her sedated. She is quite troubled by her cough. She is unsure what to do at this time.  Social Hx   Social History   Social History  . Marital status: Widowed    Spouse name: N/A  . Number of children: N/A  . Years of education: N/A   Social History Main Topics  . Smoking status: Never Smoker  . Smokeless tobacco: Never Used  . Alcohol use 1.2 oz/week    1 Glasses of wine, 1 Cans of beer per week     Comment: 09/27/2014 "glass of wine once/month; might have a beer once/month too"  . Drug use: No  . Sexual activity: Yes   Other Topics Concern  . None   Social History Narrative  . None    Review of Systems  Constitutional: Positive for fatigue.  Respiratory: Positive for cough.    Objective:  BP (!) 162/100   Pulse 83   Temp 98.3 F (36.8 C) (Oral)   Wt 215 lb 2 oz (97.6 kg)   SpO2 96%   BMI 36.93 kg/m   BP/Weight 04/29/2016 04/12/2016 08/07/348  Systolic BP 093 818 299  Diastolic BP 371 84 64  Wt. (Lbs) 215.13 217 216.4  BMI 36.93 37.25 37.14    Physical Exam  Constitutional: She is oriented to person,  place, and time. She appears well-developed. No distress.  Cardiovascular: Normal rate and regular rhythm.   3/6 systolic murmur.  Pulmonary/Chest: Effort normal and breath sounds normal. She has no wheezes. She has no rales.  Neurological: She is alert and oriented to person, place, and time.  Psychiatric: She has a normal mood and affect.  Vitals reviewed.   Assessment & Plan:   Problem List Items Addressed This Visit    Chronic cough    Established problem, worsening. Referring to pulmonology. [facility] Tussionex as needed.       Relevant Orders   Ambulatory referral to Pulmonology      Meds ordered this encounter  Medications  . chlorpheniramine-HYDROcodone (TUSSIONEX PENNKINETIC ER) 10-8 MG/5ML SUER    Sig: Take 5 mLs by mouth every 12 (twelve) hours as needed for cough.    Dispense:  140 mL    Refill:  0  . fluticasone furoate-vilanterol (BREO ELLIPTA) 100-25 MCG/INH AEPB    Sig: Inhale 1 puff into the lungs daily.    Dispense:  28 each    Refill:  0    Follow-up: Later this year.  [place]

## 2016-04-29 NOTE — Telephone Encounter (Signed)
Pt called and wanted to know if when the pulmonology referral is put in if she could have it scheduled for this Friday 3/23 in the morning. Please advise, thank you!

## 2016-04-29 NOTE — Progress Notes (Signed)
Pre visit review using our clinic review tool, if applicable. No additional management support is needed unless otherwise documented below in the visit note. 

## 2016-05-08 ENCOUNTER — Observation Stay: Payer: BLUE CROSS/BLUE SHIELD

## 2016-05-08 ENCOUNTER — Emergency Department: Payer: BLUE CROSS/BLUE SHIELD

## 2016-05-08 ENCOUNTER — Observation Stay
Admission: EM | Admit: 2016-05-08 | Discharge: 2016-05-10 | Disposition: A | Discharge status: Home / Self Care | Payer: BLUE CROSS/BLUE SHIELD | Attending: Surgery | Admitting: Surgery

## 2016-05-08 DIAGNOSIS — Z7982 Long term (current) use of aspirin: Secondary | ICD-10-CM | POA: Diagnosis not present

## 2016-05-08 DIAGNOSIS — R1011 Right upper quadrant pain: Secondary | ICD-10-CM | POA: Diagnosis present

## 2016-05-08 DIAGNOSIS — K439 Ventral hernia without obstruction or gangrene: Secondary | ICD-10-CM | POA: Insufficient documentation

## 2016-05-08 DIAGNOSIS — E7800 Pure hypercholesterolemia, unspecified: Secondary | ICD-10-CM | POA: Diagnosis not present

## 2016-05-08 DIAGNOSIS — R198 Other specified symptoms and signs involving the digestive system and abdomen: Secondary | ICD-10-CM

## 2016-05-08 DIAGNOSIS — J45909 Unspecified asthma, uncomplicated: Secondary | ICD-10-CM | POA: Insufficient documentation

## 2016-05-08 DIAGNOSIS — K811 Chronic cholecystitis: Principal | ICD-10-CM | POA: Insufficient documentation

## 2016-05-08 DIAGNOSIS — G4733 Obstructive sleep apnea (adult) (pediatric): Secondary | ICD-10-CM | POA: Insufficient documentation

## 2016-05-08 DIAGNOSIS — I10 Essential (primary) hypertension: Secondary | ICD-10-CM | POA: Diagnosis not present

## 2016-05-08 DIAGNOSIS — F329 Major depressive disorder, single episode, unspecified: Secondary | ICD-10-CM | POA: Diagnosis not present

## 2016-05-08 DIAGNOSIS — E119 Type 2 diabetes mellitus without complications: Secondary | ICD-10-CM | POA: Diagnosis not present

## 2016-05-08 DIAGNOSIS — F419 Anxiety disorder, unspecified: Secondary | ICD-10-CM | POA: Diagnosis not present

## 2016-05-08 DIAGNOSIS — E785 Hyperlipidemia, unspecified: Secondary | ICD-10-CM | POA: Insufficient documentation

## 2016-05-08 DIAGNOSIS — K219 Gastro-esophageal reflux disease without esophagitis: Secondary | ICD-10-CM | POA: Diagnosis not present

## 2016-05-08 DIAGNOSIS — Z79899 Other long term (current) drug therapy: Secondary | ICD-10-CM | POA: Diagnosis not present

## 2016-05-08 DIAGNOSIS — K660 Peritoneal adhesions (postprocedural) (postinfection): Secondary | ICD-10-CM | POA: Insufficient documentation

## 2016-05-08 DIAGNOSIS — Z794 Long term (current) use of insulin: Secondary | ICD-10-CM | POA: Diagnosis not present

## 2016-05-08 DIAGNOSIS — Z85828 Personal history of other malignant neoplasm of skin: Secondary | ICD-10-CM | POA: Insufficient documentation

## 2016-05-08 DIAGNOSIS — R110 Nausea: Secondary | ICD-10-CM

## 2016-05-08 LAB — CBC
HEMATOCRIT: 44.5 % (ref 35.0–47.0)
HEMOGLOBIN: 15.3 g/dL (ref 12.0–16.0)
MCH: 28.5 pg (ref 26.0–34.0)
MCHC: 34.3 g/dL (ref 32.0–36.0)
MCV: 83.1 fL (ref 80.0–100.0)
Platelets: 232 10*3/uL (ref 150–440)
RBC: 5.36 MIL/uL — AB (ref 3.80–5.20)
RDW: 13.5 % (ref 11.5–14.5)
WBC: 12.5 10*3/uL — AB (ref 3.6–11.0)

## 2016-05-08 LAB — COMPREHENSIVE METABOLIC PANEL
ALK PHOS: 98 U/L (ref 38–126)
ALT: 16 U/L (ref 14–54)
AST: 23 U/L (ref 15–41)
Albumin: 4.3 g/dL (ref 3.5–5.0)
Anion gap: 7 (ref 5–15)
BILIRUBIN TOTAL: 0.5 mg/dL (ref 0.3–1.2)
BUN: 12 mg/dL (ref 6–20)
CHLORIDE: 100 mmol/L — AB (ref 101–111)
CO2: 26 mmol/L (ref 22–32)
Calcium: 9.5 mg/dL (ref 8.9–10.3)
Creatinine, Ser: 0.5 mg/dL (ref 0.44–1.00)
Glucose, Bld: 290 mg/dL — ABNORMAL HIGH (ref 65–99)
Potassium: 3.8 mmol/L (ref 3.5–5.1)
Sodium: 133 mmol/L — ABNORMAL LOW (ref 135–145)
Total Protein: 8 g/dL (ref 6.5–8.1)

## 2016-05-08 LAB — URINALYSIS, COMPLETE (UACMP) WITH MICROSCOPIC
Bacteria, UA: NONE SEEN
Bilirubin Urine: NEGATIVE
Ketones, ur: 5 mg/dL — AB
Leukocytes, UA: NEGATIVE
Nitrite: NEGATIVE
PH: 6 (ref 5.0–8.0)
PROTEIN: 30 mg/dL — AB
Specific Gravity, Urine: 1.036 — ABNORMAL HIGH (ref 1.005–1.030)

## 2016-05-08 LAB — CBC WITH DIFFERENTIAL/PLATELET
BASOS ABS: 0 10*3/uL (ref 0–0.1)
BASOS PCT: 0 %
EOS ABS: 0.1 10*3/uL (ref 0–0.7)
EOS PCT: 1 %
HEMATOCRIT: 41.7 % (ref 35.0–47.0)
Hemoglobin: 14.3 g/dL (ref 12.0–16.0)
Lymphocytes Relative: 20 %
Lymphs Abs: 2.3 10*3/uL (ref 1.0–3.6)
MCH: 29 pg (ref 26.0–34.0)
MCHC: 34.4 g/dL (ref 32.0–36.0)
MCV: 84.5 fL (ref 80.0–100.0)
Monocytes Absolute: 0.6 10*3/uL (ref 0.2–0.9)
Monocytes Relative: 6 %
Neutro Abs: 8.2 10*3/uL — ABNORMAL HIGH (ref 1.4–6.5)
Neutrophils Relative %: 73 %
PLATELETS: 203 10*3/uL (ref 150–440)
RBC: 4.93 MIL/uL (ref 3.80–5.20)
RDW: 13.7 % (ref 11.5–14.5)
WBC: 11.3 10*3/uL — AB (ref 3.6–11.0)

## 2016-05-08 LAB — TSH: TSH: 1.807 u[IU]/mL (ref 0.350–4.500)

## 2016-05-08 LAB — MRSA PCR SCREENING: MRSA BY PCR: NEGATIVE

## 2016-05-08 LAB — LIPASE, BLOOD: LIPASE: 18 U/L (ref 11–51)

## 2016-05-08 LAB — GLUCOSE, CAPILLARY
Glucose-Capillary: 184 mg/dL — ABNORMAL HIGH (ref 65–99)
Glucose-Capillary: 288 mg/dL — ABNORMAL HIGH (ref 65–99)
Glucose-Capillary: 307 mg/dL — ABNORMAL HIGH (ref 65–99)

## 2016-05-08 MED ORDER — MORPHINE SULFATE (PF) 2 MG/ML IV SOLN
2.0000 mg | Freq: Once | INTRAVENOUS | Status: DC
  Filled 2016-05-08: qty 1

## 2016-05-08 MED ORDER — FLUTICASONE FUROATE-VILANTEROL 100-25 MCG/INH IN AEPB
1.0000 | INHALATION_SPRAY | Freq: Every day | RESPIRATORY_TRACT | Status: DC
  Administered 2016-05-08 – 2016-05-10 (×2): 1 via RESPIRATORY_TRACT
  Filled 2016-05-08: qty 28

## 2016-05-08 MED ORDER — INSULIN ASPART 100 UNIT/ML ~~LOC~~ SOLN
0.0000 [IU] | Freq: Three times a day (TID) | SUBCUTANEOUS | Status: DC
  Administered 2016-05-08 – 2016-05-09 (×2): 5 [IU] via SUBCUTANEOUS
  Administered 2016-05-09: 8 [IU] via SUBCUTANEOUS
  Administered 2016-05-09: 15 [IU] via SUBCUTANEOUS
  Administered 2016-05-10: 5 [IU] via SUBCUTANEOUS
  Filled 2016-05-08: qty 15
  Filled 2016-05-08: qty 5
  Filled 2016-05-08: qty 8
  Filled 2016-05-08 (×2): qty 5

## 2016-05-08 MED ORDER — ACETAMINOPHEN 650 MG RE SUPP: 650.0000 mg | Freq: Four times a day (QID) | RECTAL | Status: DC | PRN

## 2016-05-08 MED ORDER — ONDANSETRON HCL 4 MG/2ML IJ SOLN
4.0000 mg | Freq: Once | INTRAMUSCULAR | Status: AC
  Administered 2016-05-08: 4 mg via INTRAVENOUS
  Filled 2016-05-08: qty 2

## 2016-05-08 MED ORDER — ONDANSETRON HCL 4 MG/2ML IJ SOLN
4.0000 mg | Freq: Four times a day (QID) | INTRAMUSCULAR | Status: DC | PRN
  Administered 2016-05-08 – 2016-05-09 (×5): 4 mg via INTRAVENOUS
  Filled 2016-05-08 (×4): qty 2

## 2016-05-08 MED ORDER — CEFAZOLIN SODIUM-DEXTROSE 2-4 GM/100ML-% IV SOLN: 2.0000 g | INTRAVENOUS | Status: DC

## 2016-05-08 MED ORDER — ASPIRIN EC 81 MG PO TBEC
81.0000 mg | DELAYED_RELEASE_TABLET | Freq: Every day | ORAL | Status: DC
  Administered 2016-05-10: 81 mg via ORAL
  Filled 2016-05-08: qty 1

## 2016-05-08 MED ORDER — INSULIN ASPART 100 UNIT/ML ~~LOC~~ SOLN
0.0000 [IU] | Freq: Four times a day (QID) | SUBCUTANEOUS | Status: DC
  Administered 2016-05-08: 11 [IU] via SUBCUTANEOUS
  Administered 2016-05-08: 3 [IU] via SUBCUTANEOUS
  Administered 2016-05-08: 8 [IU] via SUBCUTANEOUS
  Filled 2016-05-08: qty 3
  Filled 2016-05-08: qty 8
  Filled 2016-05-08: qty 11

## 2016-05-08 MED ORDER — ROSUVASTATIN CALCIUM 10 MG PO TABS
10.0000 mg | ORAL_TABLET | Freq: Every day | ORAL | Status: DC
  Administered 2016-05-08 – 2016-05-09 (×2): 10 mg via ORAL
  Filled 2016-05-08 (×2): qty 1

## 2016-05-08 MED ORDER — ALPRAZOLAM 0.5 MG PO TABS: 0.5000 mg | ORAL_TABLET | Freq: Every evening | ORAL | Status: DC | PRN

## 2016-05-08 MED ORDER — CEFAZOLIN SODIUM-DEXTROSE 2-3 GM-% IV SOLR
2.0000 g | INTRAVENOUS | Status: AC
  Administered 2016-05-09: 2 g via INTRAVENOUS
  Filled 2016-05-08 (×2): qty 50

## 2016-05-08 MED ORDER — FUROSEMIDE 20 MG PO TABS: 20.0000 mg | ORAL_TABLET | Freq: Two times a day (BID) | ORAL | Status: DC | PRN

## 2016-05-08 MED ORDER — ONDANSETRON HCL 4 MG PO TABS: 4.0000 mg | ORAL_TABLET | Freq: Four times a day (QID) | ORAL | Status: DC | PRN

## 2016-05-08 MED ORDER — DOCUSATE SODIUM 100 MG PO CAPS
100.0000 mg | ORAL_CAPSULE | Freq: Two times a day (BID) | ORAL | Status: DC
  Administered 2016-05-08 – 2016-05-10 (×4): 100 mg via ORAL
  Filled 2016-05-08 (×4): qty 1

## 2016-05-08 MED ORDER — IOPAMIDOL (ISOVUE-300) INJECTION 61%: 30.0000 mL | Freq: Once | INTRAVENOUS | Status: DC | PRN

## 2016-05-08 MED ORDER — MORPHINE SULFATE (PF) 2 MG/ML IV SOLN
2.0000 mg | Freq: Once | INTRAVENOUS | Status: AC
  Administered 2016-05-08: 2 mg via INTRAVENOUS
  Filled 2016-05-08: qty 1

## 2016-05-08 MED ORDER — CHLORHEXIDINE GLUCONATE CLOTH 2 % EX PADS: 6.0000 | MEDICATED_PAD | Freq: Once | CUTANEOUS | Status: DC

## 2016-05-08 MED ORDER — CITALOPRAM HYDROBROMIDE 20 MG PO TABS
20.0000 mg | ORAL_TABLET | Freq: Every day | ORAL | Status: DC
  Administered 2016-05-10: 20 mg via ORAL
  Filled 2016-05-08: qty 1

## 2016-05-08 MED ORDER — TECHNETIUM TC 99M MEBROFENIN IV KIT
7.8700 | PACK | Freq: Once | INTRAVENOUS | Status: AC | PRN
  Administered 2016-05-08: 7.87 via INTRAVENOUS

## 2016-05-08 MED ORDER — MORPHINE SULFATE (PF) 2 MG/ML IV SOLN
2.0000 mg | INTRAVENOUS | Status: DC | PRN
  Administered 2016-05-08 – 2016-05-09 (×3): 2 mg via INTRAVENOUS
  Filled 2016-05-08 (×2): qty 1

## 2016-05-08 MED ORDER — PNEUMOCOCCAL VAC POLYVALENT 25 MCG/0.5ML IJ INJ: 0.5000 mL | INJECTION | INTRAMUSCULAR | Status: DC

## 2016-05-08 MED ORDER — IOPAMIDOL (ISOVUE-300) INJECTION 61%
30.0000 mL | Freq: Once | INTRAVENOUS | Status: AC | PRN
  Administered 2016-05-08: 30 mL via ORAL

## 2016-05-08 MED ORDER — INSULIN DETEMIR 100 UNIT/ML ~~LOC~~ SOLN
24.0000 [IU] | Freq: Every day | SUBCUTANEOUS | Status: DC
  Administered 2016-05-08 – 2016-05-09 (×2): 24 [IU] via SUBCUTANEOUS
  Filled 2016-05-08 (×2): qty 0.24

## 2016-05-08 MED ORDER — IOPAMIDOL (ISOVUE-300) INJECTION 61%
100.0000 mL | Freq: Once | INTRAVENOUS | Status: AC | PRN
  Administered 2016-05-08: 100 mL via INTRAVENOUS

## 2016-05-08 MED ORDER — LOSARTAN POTASSIUM 50 MG PO TABS
50.0000 mg | ORAL_TABLET | Freq: Every day | ORAL | Status: DC
  Administered 2016-05-08 – 2016-05-10 (×2): 50 mg via ORAL
  Filled 2016-05-08 (×2): qty 1

## 2016-05-08 MED ORDER — SODIUM CHLORIDE 0.9 % IV SOLN
INTRAVENOUS | Status: DC
  Administered 2016-05-08 – 2016-05-09 (×3): via INTRAVENOUS

## 2016-05-08 MED ORDER — PANTOPRAZOLE SODIUM 40 MG PO TBEC
40.0000 mg | DELAYED_RELEASE_TABLET | Freq: Every day | ORAL | Status: DC
  Administered 2016-05-10: 40 mg via ORAL
  Filled 2016-05-08 (×2): qty 1

## 2016-05-08 MED ORDER — ACETAMINOPHEN 325 MG PO TABS: 650.0000 mg | ORAL_TABLET | Freq: Four times a day (QID) | ORAL | Status: DC | PRN

## 2016-05-08 MED ORDER — CHLORHEXIDINE GLUCONATE CLOTH 2 % EX PADS
6.0000 | MEDICATED_PAD | Freq: Once | CUTANEOUS | Status: AC
  Administered 2016-05-08: 6 via TOPICAL

## 2016-05-08 NOTE — H&P (Signed)
Michaela Tucker is an 61 y.o. female.   Chief Complaint: Abdominal pain HPI: The patient with past medical history of diabetes presents emergency department complaining of right upper quadrant abdominal pain. The patient states that her pain began a possibly 4 days ago and has been intermittent but became so severe tonight that she was barely able to stand up straight. She minutes to feeling nauseous but has not had any vomiting. She denies constipation or diarrhea (although notably has not had a stool today). She admits to subjective fever at times. In the emergency department ultrasound did not demonstrate gallstones but the patient did have a positive Murphy sign. Laboratory evaluation revealed mild leukocytosis. Surgery was consulted who did not feel as if she were in acute cervical patient at this time. Thus the hospitalist service was asked to admit for further evaluation.  Past Medical History:  Diagnosis Date  . Anxiety   . Arthritis   . Asthma   . BBB (bundle branch block)   . Cancer ([place])    skin  . Chicken pox   . Chronic bronchitis ([place])   . Complication of anesthesia    "it's harder for me to wake up cause of my sleep apnea" (09/27/2014)  . Hypercholesterolemia   . OSA on CPAP   . Pneumonia   . Type II diabetes mellitus ([place])     Past Surgical History:  Procedure Laterality Date  . ANTERIOR CERVICAL DECOMP/DISCECTOMY FUSION  12/2004  . BACK SURGERY    . CESAREAN SECTION  1995  . CYST EXCISION  1994    Family History  Problem Relation Age of Onset  . Breast cancer Cousin 50    maternal side  . Lung cancer Mother   . Lung cancer Father   . Hyperlipidemia Father   . Diabetes Father   . Diabetes Sister   . Diabetes Brother    Social History:  reports that she has never smoked. She has never used smokeless tobacco. She reports that she drinks about 1.2 oz of alcohol per week . She reports that she does not use drugs.  Allergies:  Allergies  Allergen Reactions  .  Codeine Phosphate Nausea Only    Can take in cough medication, but not pain medication.  . Lovastatin Other (See Comments)    REACTION: Muscle aches    Medications Prior to Admission  Medication Sig Dispense Refill  . albuterol (PROVENTIL HFA;VENTOLIN HFA) 108 (90 Base) MCG/ACT inhaler Inhale 2 puffs into the lungs every 6 (six) hours as needed for wheezing or shortness of breath. 1 Inhaler 0  . ALPRAZolam (XANAX) 0.5 MG tablet Take 0.5 mg by mouth at bedtime as needed for anxiety.    Marland Kitchen aspirin EC 81 MG tablet Take 81 mg by mouth daily.    . chlorpheniramine-HYDROcodone (TUSSIONEX PENNKINETIC ER) 10-8 MG/5ML SUER Take 5 mLs by mouth every 12 (twelve) hours as needed for cough. 140 mL 0  . citalopram (CELEXA) 20 MG tablet Take 1 tablet (20 mg total) by mouth daily. (Patient taking differently: Take 10 mg by mouth daily. ) 90 tablet 1  . fluticasone furoate-vilanterol (BREO ELLIPTA) 100-25 MCG/INH AEPB Inhale 1 puff into the lungs daily. 28 each 0  . furosemide (LASIX) 20 MG tablet Take 20 mg by mouth 2 (two) times daily as needed for edema. swelling     . glucose blood (BAYER CONTOUR NEXT TEST) test strip 1 each by Other route 2 (two) times daily. And lancets 2/day 100 each  12  . insulin aspart (NOVOLOG FLEXPEN) 100 UNIT/ML FlexPen 3 times a day (just before each meal) 11-21-18 units, and pen needles 4/day. (Patient taking differently: Inject 10-20 Units into the skin 3 (three) times daily with meals. 10 units with breakfast, 10 units at lunch, and 20 units at dinner) 15 mL 11  . Insulin Detemir (LEVEMIR) 100 UNIT/ML Pen Inject 40 Units into the skin at bedtime. 5 pen 11  . losartan (COZAAR) 50 MG tablet Take 1 tablet (50 mg total) by mouth daily. 90 tablet 3  . pantoprazole (PROTONIX) 40 MG tablet Take 1 tablet (40 mg total) by mouth daily. 90 tablet 1  . rosuvastatin (CRESTOR) 10 MG tablet Take 1 tablet (10 mg total) by mouth daily. 90 tablet 3    Results for orders placed or performed during  the hospital encounter of 05/08/16 (from the past 48 hour(s))  CBC     Status: Abnormal   Collection Time: 05/08/16 12:12 AM  Result Value Ref Range   WBC 12.5 (H) 3.6 - 11.0 K/uL   RBC 5.36 (H) 3.80 - 5.20 MIL/uL   Hemoglobin 15.3 12.0 - 16.0 g/dL   HCT 44.5 35.0 - 47.0 %   MCV 83.1 80.0 - 100.0 fL   MCH 28.5 26.0 - 34.0 pg   MCHC 34.3 32.0 - 36.0 g/dL   RDW 13.5 11.5 - 14.5 %   Platelets 232 150 - 440 K/uL  Comprehensive metabolic panel     Status: Abnormal   Collection Time: 05/08/16 12:12 AM  Result Value Ref Range   Sodium 133 (L) 135 - 145 mmol/L   Potassium 3.8 3.5 - 5.1 mmol/L   Chloride 100 (L) 101 - 111 mmol/L   CO2 26 22 - 32 mmol/L   Glucose, Bld 290 (H) 65 - 99 mg/dL   BUN 12 6 - 20 mg/dL   Creatinine, Ser 0.50 0.44 - 1.00 mg/dL   Calcium 9.5 8.9 - 10.3 mg/dL   Total Protein 8.0 6.5 - 8.1 g/dL   Albumin 4.3 3.5 - 5.0 g/dL   AST 23 15 - 41 U/L   ALT 16 14 - 54 U/L   Alkaline Phosphatase 98 38 - 126 U/L   Total Bilirubin 0.5 0.3 - 1.2 mg/dL   GFR calc non Af Amer >60 >60 mL/min   GFR calc Af Amer >60 >60 mL/min    Comment: (NOTE) The eGFR has been calculated using the CKD EPI equation. This calculation has not been validated in all clinical situations. eGFR's persistently <60 mL/min signify possible Chronic Kidney Disease.    Anion gap 7 5 - 15  Lipase, blood     Status: None   Collection Time: 05/08/16 12:12 AM  Result Value Ref Range   Lipase 18 11 - 51 U/L  Urinalysis, Complete w Microscopic     Status: Abnormal   Collection Time: 05/08/16 12:13 AM  Result Value Ref Range   Color, Urine YELLOW (A) YELLOW   APPearance CLEAR (A) CLEAR   Specific Gravity, Urine 1.036 (H) 1.005 - 1.030   pH 6.0 5.0 - 8.0   Glucose, UA >=500 (A) NEGATIVE mg/dL   Hgb urine dipstick SMALL (A) NEGATIVE   Bilirubin Urine NEGATIVE NEGATIVE   Ketones, ur 5 (A) NEGATIVE mg/dL   Protein, ur 30 (A) NEGATIVE mg/dL   Nitrite NEGATIVE NEGATIVE   Leukocytes, UA NEGATIVE NEGATIVE    RBC / HPF 0-5 0 - 5 RBC/hpf   WBC, UA 0-5 0 - 5  WBC/hpf   Bacteria, UA NONE SEEN NONE SEEN   Squamous Epithelial / LPF 0-5 (A) NONE SEEN   Hyaline Casts, UA PRESENT   Glucose, capillary     Status: Abnormal   Collection Time: 05/08/16  7:40 AM  Result Value Ref Range   Glucose-Capillary 184 (H) 65 - 99 mg/dL   Ct Abdomen Pelvis W Contrast  Result Date: 05/08/2016 CLINICAL DATA:  Right upper and right lower quadrant pain.  Nausea. EXAM: CT ABDOMEN AND PELVIS WITH CONTRAST TECHNIQUE: Multidetector CT imaging of the abdomen and pelvis was performed using the standard protocol following bolus administration of intravenous contrast. CONTRAST:  152m ISOVUE-300 IOPAMIDOL (ISOVUE-300) INJECTION 61% COMPARISON:  Right upper quadrant ultrasound earlier this day. FINDINGS: Lower chest: No consolidation or pleural fluid. Hepatobiliary: Decreased hepatic density consistent with steatosis. No evidence of focal lesion. Gallbladder physiologically distended, no calcified stone. No biliary dilatation. Pancreas: No ductal dilatation or inflammation. Spleen: Normal in size without focal abnormality. Adrenals/Urinary Tract: Normal adrenal glands. No hydronephrosis or perinephric edema. Symmetric renal enhancement and excretion on delayed phase imaging. 18 mm simple cyst in the mid right kidney. Urinary bladder is physiologically distended without wall thickening. Stomach/Bowel: Stomach is within normal limits. Appendix appears normal. No evidence of bowel wall thickening, distention, or inflammatory changes. Vascular/Lymphatic: Aortic atherosclerosis without aneurysm. No adenopathy. Reproductive: Uterus and bilateral adnexa are unremarkable. Other: No free air, free fluid, or intra-abdominal fluid collection. Small fat containing umbilical hernia. Soft tissue densities in the anterior lower abdominal wall likely related to injections. Musculoskeletal: Degenerative change in the lower lumbar spine with grade 1  anterolisthesis of L4 on L5. There are no acute or suspicious osseous abnormalities. IMPRESSION: 1. No acute abnormality.  Normal appendix. 2. Hepatic steatosis. Electronically Signed   By: MJeb LeveringM.D.   On: 05/08/2016 04:25   [place]dom[place] Limited Ruq  Result Date: 05/08/2016 CLINICAL DATA:  61y/o  F; nausea and right upper quadrant pain. EXAM: [place] LIMITED - RIGHT UPPER QUADRANT COMPARISON:  01/16/2016 right upper quadrant ultrasound. FINDINGS: Gallbladder: No gallstones or wall thickening visualized. Positive sonographic Murphy sign. Common bile duct: Diameter: 4.8 mm Liver: Increased liver echogenicity compatible with steatosis. Right kidney interpolar anechoic structure with enhanced through transmission measuring up to 1.6 cm compatible with a simple cyst. IMPRESSION: 1. Positive sonographic Murphy sign, very tender right upper quadrant under ultrasound pressure. No secondary signs of acute cholecystitis. No bile duct dilatation. 2. Hepatic steatosis. Electronically Signed   By: LKristine GarbeM.D.   On: 05/08/2016 03:33    Review of Systems  Constitutional: Positive for fever (subjective). Negative for chills.  HENT: Negative for sore throat and tinnitus.   Eyes: Negative for blurred vision and redness.  Respiratory: Negative for cough and shortness of breath.   Cardiovascular: Negative for chest pain, palpitations, orthopnea and PND.  Gastrointestinal: Positive for abdominal pain and nausea. Negative for diarrhea and vomiting.  Genitourinary: Negative for dysuria, frequency and urgency.  Musculoskeletal: Negative for joint pain and myalgias.  Skin: Negative for rash.       No lesions  Neurological: Negative for speech change, focal weakness and weakness.  Endo/Heme/Allergies: Does not bruise/bleed easily.       No temperature intolerance  Psychiatric/Behavioral: Negative for depression and suicidal ideas.    Blood pressure (!) 151/75, pulse 87, temperature 97.8  F (36.6 C), temperature source Oral, resp. rate 16, height 5' 5"  (1.651 m), weight 99 kg (218 lb 3.2 oz), SpO2 98 %.  Physical Exam  Vitals reviewed. Constitutional: She is oriented to person, place, and time. She appears well-developed and well-nourished. No distress.  HENT:  Head: Normocephalic and atraumatic.  Mouth/Throat: Oropharynx is clear and moist.  Eyes: Conjunctivae and EOM are normal. Pupils are equal, round, and reactive to light. No scleral icterus.  Neck: Normal range of motion. Neck supple. No JVD present. No tracheal deviation present. No thyromegaly present.  Cardiovascular: Normal rate, regular rhythm and normal heart sounds.  Exam reveals no gallop and no friction rub.   No murmur heard. Respiratory: Effort normal and breath sounds normal.  GI: Soft. Bowel sounds are normal. She exhibits no distension and no mass. There is tenderness. There is no rebound and no guarding.  Genitourinary:  Genitourinary Comments: Deferred  Musculoskeletal: Normal range of motion. She exhibits no edema.  Lymphadenopathy:    She has no cervical adenopathy.  Neurological: She is alert and oriented to person, place, and time. No cranial nerve deficit. She exhibits normal muscle tone.  Skin: Skin is warm and dry. No rash noted. No erythema.  Psychiatric: She has a normal mood and affect. Her behavior is normal. Judgment and thought content normal.     Assessment/Plan This is a 61 year old female admitted for right upper quadrant pain. 1. Right upper quadrant pain: Concerning for acalculous cholecystitis. I have made the patient nothing by mouth in preparation for a HIDA scan. Consult general surgery. Manage pain. 2. Diabetes mellitus type 2: 10 to basal insulin therapy adjusted for hospital diet. Sliding insulin while hospitalized. 3. Obstructive sleep apnea: CPAP at night 4. DVT prophylaxis: SCDs 5. GI prophylaxis: Pantoprazole per home regimen The patient is a full code. Time spent on  admission orders and patient care proximally 45 minutes  Harrie Foreman, MD 05/08/2016, 8:24 AM

## 2016-05-08 NOTE — ED Notes (Signed)
Report off to iris rn  

## 2016-05-08 NOTE — ED Notes (Signed)
Pt reports abd pain radiating into mid back.  No urinary sx.  No v/d  Pt has nausea.  Sx for 3 days.  Pt took naprosyn without pain relief yesterday.  Pt alert.

## 2016-05-08 NOTE — ED Provider Notes (Signed)
[facility] Emergency Department Provider Note   Time seen:  I have reviewed the triage vital signs and the nursing notes.   HISTORY  Chief Complaint Abdominal Pain    HPI Michaela Tucker is a 61 y.o. female with below list of chronic medical conditions presents to the emergency department with right upper quadrant abdominal pain with radiation to the back times one week. Patient states pain is worse following eating. Patient denies any urinary symptoms. Patient also admits to nausea however no vomiting. Patient denies any fever     Past Medical History:  Diagnosis Date  . Anxiety   . Arthritis   . Asthma   . BBB (bundle branch block)   . Cancer ([place])    skin  . Chicken pox   . Chronic bronchitis ([place])   . Complication of anesthesia    "it's harder for me to wake up cause of my sleep apnea" (09/27/2014)  . Hypercholesterolemia   . OSA on CPAP   . Pneumonia   . Type II diabetes mellitus [facility])     Patient Active Problem List   Diagnosis Date Noted  . Viral URI 02/23/2016  . GERD (gastroesophageal reflux disease) 04/26/2015  . Chronic cough 04/26/2015  . Anxiety and depression 04/25/2015  . Diabetes mellitus without complication ([place]) 39/76/7341  . Hyperlipidemia 09/26/2014  . Essential hypertension 07/25/2006    Past Surgical History:  Procedure Laterality Date  . ANTERIOR CERVICAL DECOMP/DISCECTOMY FUSION  12/2004  . BACK SURGERY    . CESAREAN SECTION  1995  . CYST EXCISION  1994    Prior to Admission medications   Medication Sig Start Date End Date Taking? Authorizing Provider  albuterol (PROVENTIL HFA;VENTOLIN HFA) 108 (90 Base) MCG/ACT inhaler Inhale 2 puffs into the lungs every 6 (six) hours as needed for wheezing or shortness of breath. 04/25/15   Coral Spikes, DO  ALPRAZolam (XANAX) 0.5 MG tablet Take 0.5 mg by mouth at bedtime as needed for anxiety.    Historical Provider, MD  aspirin EC 81 MG tablet Take 81 mg by mouth  daily.    Historical Provider, MD  chlorpheniramine-HYDROcodone (TUSSIONEX PENNKINETIC ER) 10-8 MG/5ML SUER Take 5 mLs by mouth every 12 (twelve) hours as needed for cough. 04/29/16   Coral Spikes, DO  citalopram (CELEXA) 20 MG tablet Take 1 tablet (20 mg total) by mouth daily. 04/10/16   Jayce G Cook, DO  fluticasone furoate-vilanterol (BREO ELLIPTA) 100-25 MCG/INH AEPB Inhale 1 puff into the lungs daily. 04/29/16   Coral Spikes, DO  furosemide (LASIX) 20 MG tablet Take 20 mg by mouth 2 (two) times daily as needed. swelling    Historical Provider, MD  glucose blood (BAYER CONTOUR NEXT TEST) test strip 1 each by Other route 2 (two) times daily. And lancets 2/day 02/16/16   Renato Shin, MD  insulin aspart (NOVOLOG FLEXPEN) 100 UNIT/ML FlexPen 3 times a day (just before each meal) 11-21-18 units, and pen needles 4/day. 04/12/16   Renato Shin, MD  Insulin Detemir (LEVEMIR) 100 UNIT/ML Pen Inject 40 Units into the skin at bedtime. 04/12/16   Renato Shin, MD  losartan (COZAAR) 50 MG tablet Take 1 tablet (50 mg total) by mouth daily. 01/24/16   Coral Spikes, DO  pantoprazole (PROTONIX) 40 MG tablet Take 1 tablet (40 mg total) by mouth daily. 01/24/16   Coral Spikes, DO  rosuvastatin (CRESTOR) 10 MG tablet Take 1 tablet (10 mg total) by mouth daily.  01/24/16   Coral Spikes, DO  sitaGLIPtin-metformin (JANUMET) 50-1000 MG tablet Take 1 tablet by mouth 2 (two) times daily with a meal. Reported on 04/25/2015 01/24/16   Coral Spikes, DO    Allergies Codeine phosphate and Lovastatin  Family History  Problem Relation Age of Onset  . Breast cancer Cousin 50    maternal side  . Lung cancer Mother   . Lung cancer Father   . Hyperlipidemia Father   . Diabetes Father   . Diabetes Sister   . Diabetes Brother     Social History Social History  Substance Use Topics  . Smoking status: Never Smoker  . Smokeless tobacco: Never Used  . Alcohol use 1.2 oz/week    1 Glasses of wine, 1 Cans of beer per week      Comment: 09/27/2014 "glass of wine once/month; might have a beer once/month too"    Review of Systems Constitutional: No fever/chills Eyes: No visual changes. ENT: No sore throat. Cardiovascular: Denies chest pain. Respiratory: Denies shortness of breath. Gastrointestinal: Positive for abdominal pain and nausea  Genitourinary: Negative for dysuria. Musculoskeletal: Negative for back pain. Skin: Negative for rash. Neurological: Negative for headaches, focal weakness or numbness.  10-point ROS otherwise negative.  ____________________________________________   PHYSICAL EXAM:  VITAL SIGNS: ED Triage Vitals  Enc Vitals Group     BP 05/08/16 0008 (!) 159/86     Pulse Rate 05/08/16 0008 90     Resp 05/08/16 0008 18     Temp 05/08/16 0008 98 F (36.7 C)     Temp Source 05/08/16 0008 Oral     SpO2 05/08/16 0008 96 %     Weight 05/08/16 0008 200 lb (90.7 kg)     Height 05/08/16 0008 5\' 5"  (1.651 m)     Head Circumference --      Peak Flow --      Pain Score 05/08/16 0007 8     Pain Loc --      Pain Edu? --      Excl. in [place]? --     Constitutional: Alert and oriented. Apparent discomfort Eyes: Conjunctivae are normal. PERRL. EOMI. Head: Atraumatic. Mouth/Throat: Mucous membranes are moist. Oropharynx non-erythematous. Neck: No stridor.   Cardiovascular: Normal rate, regular rhythm. Good peripheral circulation. Grossly normal heart sounds. Respiratory: Normal respiratory effort.  No retractions. Lungs CTAB. Gastrointestinal:RUQ pain with palpation. No distention.  Musculoskeletal: No lower extremity tenderness nor edema. No gross deformities of extremities. Neurologic:  Normal speech and language. No gross focal neurologic deficits are appreciated.  Skin:  Skin is warm, dry and intact. No rash noted. Psychiatric: Mood and affect are normal. Speech and behavior are normal.  ____________________________________________   LABS (all labs ordered are listed, but only abnormal  results are displayed)  Labs Reviewed  CBC - Abnormal; Notable for the following:       Result Value   WBC 12.5 (*)    RBC 5.36 (*)    All other components within normal limits  COMPREHENSIVE METABOLIC PANEL - Abnormal; Notable for the following:    Sodium 133 (*)    Chloride 100 (*)    Glucose, Bld 290 (*)    All other components within normal limits  URINALYSIS, COMPLETE (UACMP) WITH MICROSCOPIC - Abnormal; Notable for the following:    Color, Urine YELLOW (*)    APPearance CLEAR (*)    Specific Gravity, Urine 1.036 (*)    Glucose, UA >=500 (*)    Hgb  urine dipstick SMALL (*)    Ketones, ur 5 (*)    Protein, ur 30 (*)    Squamous Epithelial / LPF 0-5 (*)    All other components within normal limits  LIPASE, BLOOD     RADIOLOGY I, [place] Sharonne Ricketts, personally viewed and evaluated these images (plain radiographs) as part of my medical decision making, as well as reviewing the written report by the radiologist.  Ct Abdomen Pelvis W Contrast  Result Date: 05/08/2016 CLINICAL DATA:  Right upper and right lower quadrant pain.  Nausea. EXAM: CT ABDOMEN AND PELVIS WITH CONTRAST TECHNIQUE: Multidetector CT imaging of the abdomen and pelvis was performed using the standard protocol following bolus administration of intravenous contrast. CONTRAST:  127mL ISOVUE-300 IOPAMIDOL (ISOVUE-300) INJECTION 61% COMPARISON:  Right upper quadrant ultrasound earlier this day. FINDINGS: Lower chest: No consolidation or pleural fluid. Hepatobiliary: Decreased hepatic density consistent with steatosis. No evidence of focal lesion. Gallbladder physiologically distended, no calcified stone. No biliary dilatation. Pancreas: No ductal dilatation or inflammation. Spleen: Normal in size without focal abnormality. Adrenals/Urinary Tract: Normal adrenal glands. No hydronephrosis or perinephric edema. Symmetric renal enhancement and excretion on delayed phase imaging. 18 mm simple cyst in the mid right kidney. Urinary  bladder is physiologically distended without wall thickening. Stomach/Bowel: Stomach is within normal limits. Appendix appears normal. No evidence of bowel wall thickening, distention, or inflammatory changes. Vascular/Lymphatic: Aortic atherosclerosis without aneurysm. No adenopathy. Reproductive: Uterus and bilateral adnexa are unremarkable. Other: No free air, free fluid, or intra-abdominal fluid collection. Small fat containing umbilical hernia. Soft tissue densities in the anterior lower abdominal wall likely related to injections. Musculoskeletal: Degenerative change in the lower lumbar spine with grade 1 anterolisthesis of L4 on L5. There are no acute or suspicious osseous abnormalities. IMPRESSION: 1. No acute abnormality.  Normal appendix. 2. Hepatic steatosis. Electronically Signed   By: Jeb Levering M.D.   On: 05/08/2016 04:25   US Abdomen Limited Ruq  Result Date: 05/08/2016 CLINICAL DATA:  61 y/o  F; nausea and right upper quadrant pain. EXAM: US ABDOMEN LIMITED - RIGHT UPPER QUADRANT COMPARISON:  01/16/2016 right upper quadrant ultrasound. FINDINGS: Gallbladder: No gallstones or wall thickening visualized. Positive sonographic Murphy sign. Common bile duct: Diameter: 4.8 mm Liver: Increased liver echogenicity compatible with steatosis. Right kidney interpolar anechoic structure with enhanced through transmission measuring up to 1.6 cm compatible with a simple cyst. IMPRESSION: 1. Positive sonographic Murphy sign, very tender right upper quadrant under ultrasound pressure. No secondary signs of acute cholecystitis. No bile duct dilatation. 2. Hepatic steatosis. Electronically Signed   By: Kristine Garbe M.D.   On: 05/08/2016 03:33     Procedures   ____________________________________________   INITIAL IMPRESSION / ASSESSMENT AND PLAN / ED COURSE  Pertinent labs & imaging results that were available during my care of the patient were reviewed by me and considered in my  medical decision making (see chart for details).  61 year old female presenting with right upper quadrant abdominal pain associated with nausea. Initial concern for possible cholelithiasis versus cholecystitis and a such ultrasound was performed which revealed a dilated gallbladder however no evidence of cholelithiasis. CT scan of the abdomen and pelvis revealed no acute abnormality. Laboratory data notable for glucose elevated 290 white blood cell count of 12.5. Patient given IV morphine emergency department however continues to have right upper quadrant abdominal pain. Patient discussed with Dr. Hampton Abbot general surgery with concern for possible acalculous cholecystitis. Dr. Frances Furbish recommended patient be admitted to medicine and would  consult if needed. Patient discussed with Dr. Marcille Blanco for hospital admission for further evaluation and management      ____________________________________________  FINAL CLINICAL IMPRESSION(S) / ED DIAGNOSES  Final diagnoses:  Nausea  RUQ pain     MEDICATIONS GIVEN DURING THIS VISIT:  Medications  iopamidol (ISOVUE-300) 61 % injection 30 mL (not administered)  morphine 2 MG/ML injection 2 mg (2 mg Intravenous Given 05/08/16 0223)  ondansetron (ZOFRAN) injection 4 mg (4 mg Intravenous Given 05/08/16 0223)  iopamidol (ISOVUE-300) 61 % injection 30 mL (30 mLs Oral Contrast Given 05/08/16 0331)  iopamidol (ISOVUE-300) 61 % injection 100 mL (100 mLs Intravenous Contrast Given 05/08/16 0411)     NEW OUTPATIENT MEDICATIONS STARTED DURING THIS VISIT:  New Prescriptions   No medications on file    Modified Medications   No medications on file    Discontinued Medications   No medications on file     Note:  This document was prepared using Dragon voice recognition software and may include unintentional dictation errors.    Gregor Hams, MD 05/08/16 [phone]

## 2016-05-08 NOTE — Consult Note (Signed)
Patient ID: Michaela Tucker, female   DOB: 25-Jul-1955, 61 y.o.   MRN: [id number]  HPI Michaela Tucker is a 61 y.o. female asked to see in consultation by Dr. Verdell Carmine for RUQ pain. She reports that the pain started about 4 days ago last night intensified. It was severe, lasted several minutes and was radiating to her back. Pain was sharp in nature. She did have nausea but no vomiting. Her other workup included an ultrasound that was unremarkable and normal common bile duct and no evidence of cholecystitis. She also just had a HIDA scan showing an ejection fraction of 28% c/w biliary dysfx. Normal LFTs. 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 Anxiety   . Arthritis   . Asthma   . BBB (bundle branch block)   . Cancer ([place])    skin  . Chicken pox   . Chronic bronchitis ([place])   . Complication of anesthesia    "it's harder for me to wake up cause of my sleep apnea" (09/27/2014)  . Hypercholesterolemia   . OSA on CPAP   . Pneumonia   . Type II diabetes mellitus ([place])     Past Surgical History:  Procedure Laterality Date  . ANTERIOR CERVICAL DECOMP/DISCECTOMY FUSION  12/2004  . BACK SURGERY    . CESAREAN SECTION  1995  . CYST EXCISION  1994    Family History  Problem Relation Age of Onset  . Breast cancer Cousin 50    maternal side  . Lung cancer Mother   . Lung cancer Father   . Hyperlipidemia Father   . Diabetes Father   . Diabetes Sister   . Diabetes Brother     Social History Social History  Substance Use Topics  . Smoking status: Never Smoker  . Smokeless tobacco: Never Used  . Alcohol use 1.2 oz/week    1 Glasses of wine, 1 Cans of beer per week     Comment: 09/27/2014 "glass of wine once/month; might have a beer once/month too"    Allergies  Allergen Reactions  . Codeine Phosphate Nausea Only    Can take in cough medication, but not pain medication.  . Lovastatin Other (See  Comments)    REACTION: Muscle aches    Current Facility-Administered Medications  Medication Dose Route Frequency Provider Last Rate Last Dose  . 0.9 %  sodium chloride infusion   Intravenous Continuous Harrie Foreman, MD 125 mL/hr at 05/08/16 [phone]    . acetaminophen (TYLENOL) tablet 650 mg  650 mg Oral Q6H PRN Harrie Foreman, MD       Or  . acetaminophen (TYLENOL) suppository 650 mg  650 mg Rectal Q6H PRN Harrie Foreman, MD      . ALPRAZolam Duanne Moron) tablet 0.5 mg  0.5 mg Oral QHS PRN Harrie Foreman, MD      . Derrill Memo ON 05/09/2016] aspirin EC tablet 81 mg  81 mg Oral Daily Harrie Foreman, MD      . Derrill Memo ON 05/09/2016] ceFAZolin (ANCEF) IVPB 2g/100 mL premix  2 g Intravenous On Call to OR Jules Husbands, MD      . Chlorhexidine Gluconate Cloth 2 % PADS 6 each  6 each Topical Once Jules Husbands, MD       And  . Chlorhexidine Gluconate Cloth 2 % PADS 6 each  6 each Topical Once Jules Husbands, MD      . [  START ON 05/09/2016] citalopram (CELEXA) tablet 20 mg  20 mg Oral Daily Harrie Foreman, MD      . docusate sodium (COLACE) capsule 100 mg  100 mg Oral BID Harrie Foreman, MD   100 mg at 05/08/16 1228  . fluticasone furoate-vilanterol (BREO ELLIPTA) 100-25 MCG/INH 1 puff  1 puff Inhalation Daily Harrie Foreman, MD   1 puff at 05/08/16 1228  . [START ON 05/09/2016] furosemide (LASIX) tablet 20 mg  20 mg Oral BID PRN Harrie Foreman, MD      . insulin aspart (novoLOG) injection 0-15 Units  0-15 Units Subcutaneous Q6H Harrie Foreman, MD   8 Units at 05/08/16 1330  . insulin detemir (LEVEMIR) injection 24 Units  24 Units Subcutaneous QHS Harrie Foreman, MD      . iopamidol (ISOVUE-300) 61 % injection 30 mL  30 mL Oral Once PRN Gregor Hams, MD      . losartan (COZAAR) tablet 50 mg  50 mg Oral Daily Harrie Foreman, MD   50 mg at 05/08/16 1229  . morphine 2 MG/ML injection 2 mg  2 mg Intravenous Once Gregor Hams, MD      . morphine 2 MG/ML injection 2 mg  2 mg  Intravenous Q4H PRN Henreitta Leber, MD   2 mg at 05/08/16 1331  . ondansetron (ZOFRAN) tablet 4 mg  4 mg Oral Q6H PRN Harrie Foreman, MD       Or  . ondansetron [facility]) injection 4 mg  4 mg Intravenous Q6H PRN Harrie Foreman, MD   4 mg at 05/08/16 1335  . pantoprazole (PROTONIX) EC tablet 40 mg  40 mg Oral Daily Harrie Foreman, MD   Stopped at 05/08/16 [phone]  . [START ON 05/09/2016] pneumococcal 23 valent vaccine (PNU-IMMUNE) injection 0.5 mL  0.5 mL Intramuscular Tomorrow-1000 Henreitta Leber, MD      . rosuvastatin (CRESTOR) tablet 10 mg  10 mg Oral q1800 Harrie Foreman, MD         Review of Systems A 10 point review of systems was asked and was negative except for the information on the HPI  Physical Exam Blood pressure (!) 113/55, pulse 90, temperature 97.7 F (36.5 C), temperature source Oral, resp. rate 20, height 5\' 5"  (1.651 m), weight 99 kg (218 lb 3.2 oz), SpO2 96 %. CONSTITUTIONAL: NAD, alert EYES: Pupils are equal, round, and reactive to light, Sclera are non-icteric. EARS, NOSE, MOUTH AND THROAT: The oropharynx is clear. The oral mucosa is pink and moist. Hearing is intact to voice. LYMPH NODES:  Lymph nodes in the neck are normal. RESPIRATORY:  Lungs are clear. There is normal respiratory effort, with equal breath sounds bilaterally, and without pathologic use of accessory muscles. CARDIOVASCULAR: Heart is regular without murmurs, gallops, or rubs. Chest wall tenderness on the right side GI: The abdomen is  soft, TTP RUQ, no peritonitis. No murphy GU: Rectal deferred.   MUSCULOSKELETAL: Normal muscle strength and tone. No cyanosis or edema.   SKIN: Turgor is good and there are no pathologic skin lesions or ulcers. NEUROLOGIC: Motor and sensation is grossly normal. Cranial nerves are grossly intact. PSYCH:  Oriented to person, place and time. Affect is normal.  Data Reviewed  I have personally reviewed the patient's imaging, laboratory findings and medical  records.    Assessment/Plan RUQ differential include biliary dyskinesia vs IC neuralgia. I had a lengthy discussion about both entities and options of treating  her medically for the intercostal neuralgia and given her a trial and that this will fail going have proceeded with cholecystectomy. Option of proceeding to the operative room for laparoscopic cholecystectomy was also explained to her. At this point she feels that her gallbladder is the culprit and is causing her symptoms and she wishes to proceedThe risks, benefits, complications, treatment options, and expected outcomes were discussed with the patient. The possibilities of bleeding, recurrent infection, finding a normal gallbladder, perforation of viscus organs, damage to surrounding structures, bile leak, abscess formation, needing a drain placed, the need for additional procedures, reaction to medication, pulmonary aspiration,  failure to diagnose a condition, the possible need to convert to an open procedure, and creating a complication requiring transfusion or operation were discussed with the patient. The patient and/or family concurred with the proposed plan, giving informed consent.  Plan for lap chole in am  Caroleen Hamman, MD FACS General Surgeon 05/08/2016, 3:34 PM

## 2016-05-08 NOTE — ED Triage Notes (Signed)
Pt in with co mid abd pain radiating to back for a week. No hx of the same states does have nausea denies any dysuria.

## 2016-05-08 NOTE — ED Notes (Signed)
Iv meds given.  

## 2016-05-08 NOTE — Progress Notes (Signed)
[place] [facility] [place] NAME: Michaela Tucker    MR#:  [id number]  DATE OF BIRTH:  12/14/1955  SUBJECTIVE:   Patient here due to right upper quadrant abdominal pain. Still having some crampy abdominal pain throughout. No nausea, vomiting. Afebrile. No other complaints presently.  REVIEW OF SYSTEMS:    Review of Systems  Constitutional: Negative for chills and fever.  HENT: Negative for congestion and tinnitus.   Eyes: Negative for blurred vision and double vision.  Respiratory: Negative for cough, shortness of breath and wheezing.   Cardiovascular: Negative for chest pain, orthopnea and PND.  Gastrointestinal: Positive for abdominal pain. Negative for diarrhea, nausea and vomiting.  Genitourinary: Negative for dysuria and hematuria.  Neurological: Negative for dizziness, sensory change and focal weakness.  All other systems reviewed and are negative.   Nutrition: Clear Liquis Tolerating Diet: yES Tolerating PT: Ambulatory     DRUG ALLERGIES:   Allergies  Allergen Reactions  . Codeine Phosphate Nausea Only    Can take in cough medication, but not pain medication.  . Lovastatin Other (See Comments)    REACTION: Muscle aches    VITALS:  Blood pressure (!) 113/55, pulse 90, temperature 97.7 F (36.5 C), temperature source Oral, resp. rate 20, height 5\' 5"  (1.651 m), weight 99 kg (218 lb 3.2 oz), SpO2 96 %.  PHYSICAL EXAMINATION:   Physical Exam  GENERAL:  61 y.o.-year-old patient lying in bed in no acute distress.  EYES: Pupils equal, round, reactive to light and accommodation. No scleral icterus. Extraocular muscles intact.  HEENT: Head atraumatic, normocephalic. Oropharynx and nasopharynx clear.  NECK:  Supple, no jugular venous distention. No thyroid enlargement, no tenderness.  LUNGS: Normal breath sounds bilaterally, no wheezing, rales, rhonchi. No use of accessory muscles of respiration.  CARDIOVASCULAR: S1, S2 normal. No  murmurs, rubs, or gallops.  ABDOMEN: Soft, Tender in RUQ, Epigastric area, no rebound, rigidity, nondistended. Bowel sounds present. No organomegaly or mass.  EXTREMITIES: No cyanosis, clubbing or edema b/l.    NEUROLOGIC: Cranial nerves II through XII are intact. No focal Motor or sensory deficits b/l.   PSYCHIATRIC: The patient is alert and oriented x 3.  SKIN: No obvious rash, lesion, or ulcer.    LABORATORY PANEL:   CBC  Recent Labs Lab 05/08/16 0012  WBC 12.5*  HGB 15.3  HCT 44.5  PLT 232   ------------------------------------------------------------------------------------------------------------------  Chemistries   Recent Labs Lab 05/08/16 0012  NA 133*  K 3.8  CL 100*  CO2 26  GLUCOSE 290*  BUN 12  CREATININE 0.50  CALCIUM 9.5  AST 23  ALT 16  ALKPHOS 98  BILITOT 0.5   ------------------------------------------------------------------------------------------------------------------  Cardiac Enzymes No results for input(s): TROPONINI in the last 168 hours. ------------------------------------------------------------------------------------------------------------------  RADIOLOGY:  Ct Abdomen Pelvis W Contrast  Result Date: 05/08/2016 CLINICAL DATA:  Right upper and right lower quadrant pain.  Nausea. EXAM: CT ABDOMEN AND PELVIS WITH CONTRAST TECHNIQUE: Multidetector CT imaging of the abdomen and pelvis was performed using the standard protocol following bolus administration of intravenous contrast. CONTRAST:  110mL ISOVUE-300 IOPAMIDOL (ISOVUE-300) INJECTION 61% COMPARISON:  Right upper quadrant ultrasound earlier this day. FINDINGS: Lower chest: No consolidation or pleural fluid. Hepatobiliary: Decreased hepatic density consistent with steatosis. No evidence of focal lesion. Gallbladder physiologically distended, no calcified stone. No biliary dilatation. Pancreas: No ductal dilatation or inflammation. Spleen: Normal in size without focal abnormality.  Adrenals/Urinary Tract: Normal adrenal glands. No hydronephrosis or perinephric edema. Symmetric renal  enhancement and excretion on delayed phase imaging. 18 mm simple cyst in the mid right kidney. Urinary bladder is physiologically distended without wall thickening. Stomach/Bowel: Stomach is within normal limits. Appendix appears normal. No evidence of bowel wall thickening, distention, or inflammatory changes. Vascular/Lymphatic: Aortic atherosclerosis without aneurysm. No adenopathy. Reproductive: Uterus and bilateral adnexa are unremarkable. Other: No free air, free fluid, or intra-abdominal fluid collection. Small fat containing umbilical hernia. Soft tissue densities in the anterior lower abdominal wall likely related to injections. Musculoskeletal: Degenerative change in the lower lumbar spine with grade 1 anterolisthesis of L4 on L5. There are no acute or suspicious osseous abnormalities. IMPRESSION: 1. No acute abnormality.  Normal appendix. 2. Hepatic steatosis. Electronically Signed   By: Jeb Levering M.D.   On: 05/08/2016 04:25   Nm Hepato W/eject Fract  Result Date: 05/08/2016 CLINICAL DATA:  RIGHT upper quadrant and RIGHT lower quadrant abdominal pain, nausea, positive Murphy sign EXAM: NUCLEAR MEDICINE HEPATOBILIARY IMAGING WITH GALLBLADDER EF TECHNIQUE: Sequential images of the abdomen were obtained out to 60 minutes following intravenous administration of radiopharmaceutical. After oral ingestion of Ensure, gallbladder ejection fraction was determined. At 60 min, normal ejection fraction is greater than 33%. RADIOPHARMACEUTICALS:  7.87 mCi Tc-99m Choletec IV COMPARISON:  CT abdomen/pelvis and abdominal ultrasound of 05/08/2016 FINDINGS: Normal tracer extraction from bloodstream indicating normal hepatocellular function. Normal excretion of tracer into biliary tree. Gallbladder visualized at 15 min. Small bowel visualized at 45 min. No hepatic retention of tracer. Subjectively borderline  decreased emptying of tracer from gallbladder following fatty meal stimulation. Calculated gallbladder ejection fraction is 28%, abnormally low. Patient reported no symptoms following Ensure ingestion. Normal gallbladder ejection fraction following Ensure ingestion is greater than 33% at 1 hour. IMPRESSION: Patent biliary tree. Mildly decreased gallbladder ejection fraction of 28% following fatty meal stimulation. Electronically Signed   By: Lavonia Dana M.D.   On: 05/08/2016 12:59   US Abdomen Limited Ruq  Result Date: 05/08/2016 CLINICAL DATA:  61 y/o  F; nausea and right upper quadrant pain. EXAM: US ABDOMEN LIMITED - RIGHT UPPER QUADRANT COMPARISON:  01/16/2016 right upper quadrant ultrasound. FINDINGS: Gallbladder: No gallstones or wall thickening visualized. Positive sonographic Murphy sign. Common bile duct: Diameter: 4.8 mm Liver: Increased liver echogenicity compatible with steatosis. Right kidney interpolar anechoic structure with enhanced through transmission measuring up to 1.6 cm compatible with a simple cyst. IMPRESSION: 1. Positive sonographic Murphy sign, very tender right upper quadrant under ultrasound pressure. No secondary signs of acute cholecystitis. No bile duct dilatation. 2. Hepatic steatosis. Electronically Signed   By: Kristine Garbe M.D.   On: 05/08/2016 03:33     ASSESSMENT AND PLAN:   61 year old female with past medical history of diabetes, hypertension, hyperlipidemia, obstructive sleep apnea, anxiety, osteoarthritis who presented to the hospital due to right upper quadrant abdominal pain.  1. Abdominal pain-etiology unclear suspected to be biliary colic but extensive workup has been negative. -Abdominal ultrasound showing positive Murphy sign but no evidence of cholelithiasis. HIDA scan also negative for any acute cholecystitis. Await further surgical input. -Continue supportive care with IV fluids, antiemetics, pain control, clear liquid diet for now.  2.  Diabetes type 2 without complication-Levemir, sliding scale insulin.  3. Essential hypertension-continue losartan.  4. Depression-continue Celexa  5. Anxiety-continue Xanax     All the records are reviewed and case discussed with Care Management/Social Worker. Management plans discussed with the patient, family and they are in agreement.  CODE STATUS: Full code  DVT Prophylaxis: Ambulatory  TOTAL  TIME TAKING CARE OF THIS PATIENT: 30 minutes.   POSSIBLE D/C IN 1-2 DAYS, DEPENDING ON CLINICAL CONDITION.   Henreitta Leber M.D on 05/08/2016 at 3:11 PM  Between 7am to 6pm - Pager - [phone]  After 6pm go to [email or web address] - Proofreader  [facility]  Office  [phone]  CC: Primary care physician; Coral Spikes, DO

## 2016-05-08 NOTE — Progress Notes (Signed)
Inpatient Diabetes Program Recommendations  AACE/ADA: New Consensus Statement on Inpatient Glycemic Control (2015)  Target Ranges:  Prepandial:   less than 140 mg/dL      Peak postprandial:   less than 180 mg/dL (1-2 hours)      Critically ill patients:  140 - 180 mg/dL   Results for Michaela Tucker, Michaela Tucker (MRN [id number]) as of 05/08/2016 14:45  Ref. Range 04/12/2016 14:03  Hemoglobin A1C Unknown 12.2    Admit with: Abd Pain  History: DM  Home DM Meds: Levemir 40 units QHS       Novolog 10 units Breakfast/ 10 units Lunch/ 20 units Dinner  Current Insulin Orders: Levemir 24 units QHS      Novolog Moderate Correction Scale/ SSI (0-15 units) Q6 hours    Spoke with patient about her current A1c of 12.2%.  Explained what an A1c is and what it measures.  Reminded patient that her goal A1c is 7% or less per ADA standards to prevent both acute and long-term complications.  Explained to patient the extreme importance of good glucose control at home.  Encouraged patient to check her CBGs at least tidac at home and to record all CBGs in a logbook for her Endocrinologist to review.  Patient told me she just saw her Endocrinologist (Dr. Renato Shin with [facility]) on 04/12/16.  At that visit, patient's Levemir was reduced to 40 units QHS (was BID dosing) and Patient was instructed to start Novolog with meals.  Will likely need another A1c level by the beginning of June to assess if this current insulin regimen is working for her.   MD- Please consider the following in-hospital insulin adjustments:  Please change Novolog SSI and CBG checks to TID ac + hs (currently taking in CL diet)     --Will follow patient during hospitalization--  Wyn Quaker RN, MSN, CDE Diabetes Coordinator Inpatient Glycemic Control Team Team Pager: [phone] (8a-5p)

## 2016-05-09 ENCOUNTER — Ambulatory Visit: Payer: BLUE CROSS/BLUE SHIELD | Admitting: Family Medicine

## 2016-05-09 ENCOUNTER — Encounter
Admission: EM | Disposition: A | Discharge status: Home / Self Care | Payer: Self-pay | Source: Home / Self Care | Attending: Emergency Medicine

## 2016-05-09 ENCOUNTER — Encounter: Payer: Self-pay | Admitting: Anesthesiology

## 2016-05-09 ENCOUNTER — Observation Stay: Payer: BLUE CROSS/BLUE SHIELD | Admitting: *Deleted

## 2016-05-09 DIAGNOSIS — R1011 Right upper quadrant pain: Secondary | ICD-10-CM | POA: Diagnosis not present

## 2016-05-09 HISTORY — PX: CHOLECYSTECTOMY: SHX55

## 2016-05-09 LAB — GLUCOSE, CAPILLARY
Glucose-Capillary: 223 mg/dL — ABNORMAL HIGH (ref 65–99)
Glucose-Capillary: 232 mg/dL — ABNORMAL HIGH (ref 65–99)
Glucose-Capillary: 210 mg/dL — ABNORMAL HIGH (ref 65–99)
Glucose-Capillary: 276 mg/dL — ABNORMAL HIGH (ref 65–99)
Glucose-Capillary: 256 mg/dL — ABNORMAL HIGH (ref 65–99)
Glucose-Capillary: 262 mg/dL — ABNORMAL HIGH (ref 65–99)
Glucose-Capillary: 366 mg/dL — ABNORMAL HIGH (ref 65–99)

## 2016-05-09 LAB — HIV ANTIBODY (ROUTINE TESTING W REFLEX): HIV Screen 4th Generation wRfx: NONREACTIVE

## 2016-05-09 SURGERY — LAPAROSCOPIC CHOLECYSTECTOMY WITH INTRAOPERATIVE CHOLANGIOGRAM
Anesthesia: General | Wound class: Clean Contaminated

## 2016-05-09 MED ORDER — SEVOFLURANE IN SOLN
RESPIRATORY_TRACT | Status: AC
  Filled 2016-05-09: qty 250

## 2016-05-09 MED ORDER — ROCURONIUM BROMIDE 100 MG/10ML IV SOLN
INTRAVENOUS | Status: DC | PRN
  Administered 2016-05-09: 10 mg via INTRAVENOUS
  Administered 2016-05-09: 40 mg via INTRAVENOUS

## 2016-05-09 MED ORDER — MIDAZOLAM HCL 2 MG/2ML IJ SOLN
INTRAMUSCULAR | Status: AC
  Filled 2016-05-09: qty 2

## 2016-05-09 MED ORDER — FENTANYL CITRATE (PF) 100 MCG/2ML IJ SOLN
INTRAMUSCULAR | Status: AC
  Filled 2016-05-09: qty 2

## 2016-05-09 MED ORDER — KETOROLAC TROMETHAMINE 30 MG/ML IJ SOLN
INTRAMUSCULAR | Status: DC | PRN
  Administered 2016-05-09: 30 mg via INTRAVENOUS

## 2016-05-09 MED ORDER — PROPOFOL 10 MG/ML IV BOLUS
INTRAVENOUS | Status: AC
  Filled 2016-05-09: qty 40

## 2016-05-09 MED ORDER — IPRATROPIUM-ALBUTEROL 0.5-2.5 (3) MG/3ML IN SOLN
RESPIRATORY_TRACT | Status: AC
  Administered 2016-05-09: 3 mL via RESPIRATORY_TRACT
  Filled 2016-05-09: qty 3

## 2016-05-09 MED ORDER — SUGAMMADEX SODIUM 500 MG/5ML IV SOLN
INTRAVENOUS | Status: DC | PRN
  Administered 2016-05-09: 169.6 mg via INTRAVENOUS

## 2016-05-09 MED ORDER — PHENYLEPHRINE HCL 10 MG/ML IJ SOLN
INTRAMUSCULAR | Status: DC | PRN
  Administered 2016-05-09: 100 ug via INTRAVENOUS

## 2016-05-09 MED ORDER — FENTANYL CITRATE (PF) 100 MCG/2ML IJ SOLN: 25.0000 ug | INTRAMUSCULAR | Status: DC | PRN

## 2016-05-09 MED ORDER — IPRATROPIUM-ALBUTEROL 0.5-2.5 (3) MG/3ML IN SOLN
3.0000 mL | Freq: Once | RESPIRATORY_TRACT | Status: AC
  Administered 2016-05-09: 3 mL via RESPIRATORY_TRACT

## 2016-05-09 MED ORDER — DEXAMETHASONE SODIUM PHOSPHATE 10 MG/ML IJ SOLN
INTRAMUSCULAR | Status: AC
  Filled 2016-05-09: qty 1

## 2016-05-09 MED ORDER — SODIUM CHLORIDE 0.9 % IV SOLN
INTRAVENOUS | Status: DC
  Administered 2016-05-09 (×2): via INTRAVENOUS

## 2016-05-09 MED ORDER — GLYCOPYRROLATE 0.2 MG/ML IJ SOLN
INTRAMUSCULAR | Status: AC
  Filled 2016-05-09: qty 1

## 2016-05-09 MED ORDER — ACETAMINOPHEN 10 MG/ML IV SOLN
INTRAVENOUS | Status: DC | PRN
  Administered 2016-05-09: 1000 mg via INTRAVENOUS

## 2016-05-09 MED ORDER — KETOROLAC TROMETHAMINE 15 MG/ML IJ SOLN
30.0000 mg | Freq: Four times a day (QID) | INTRAMUSCULAR | Status: DC
  Administered 2016-05-09 – 2016-05-10 (×3): 30 mg via INTRAVENOUS
  Filled 2016-05-09 (×3): qty 2

## 2016-05-09 MED ORDER — LABETALOL HCL 5 MG/ML IV SOLN
INTRAVENOUS | Status: DC | PRN
  Administered 2016-05-09 (×4): 10 mg via INTRAVENOUS

## 2016-05-09 MED ORDER — INSULIN ASPART 100 UNIT/ML ~~LOC~~ SOLN
5.0000 [IU] | Freq: Once | SUBCUTANEOUS | Status: AC
  Administered 2016-05-09: 5 [IU] via SUBCUTANEOUS

## 2016-05-09 MED ORDER — LIDOCAINE HCL (CARDIAC) 20 MG/ML IV SOLN
INTRAVENOUS | Status: DC | PRN
  Administered 2016-05-09: 100 mg via INTRAVENOUS

## 2016-05-09 MED ORDER — BUPIVACAINE-EPINEPHRINE 0.25% -1:200000 IJ SOLN
INTRAMUSCULAR | Status: DC | PRN
  Administered 2016-05-09: 30 mL

## 2016-05-09 MED ORDER — ACETAMINOPHEN 10 MG/ML IV SOLN
INTRAVENOUS | Status: AC
  Filled 2016-05-09: qty 100

## 2016-05-09 MED ORDER — SUGAMMADEX SODIUM 200 MG/2ML IV SOLN
INTRAVENOUS | Status: AC
  Filled 2016-05-09: qty 2

## 2016-05-09 MED ORDER — ONDANSETRON HCL 4 MG/2ML IJ SOLN
4.0000 mg | Freq: Once | INTRAMUSCULAR | Status: AC | PRN
  Administered 2016-05-09: 4 mg via INTRAVENOUS

## 2016-05-09 MED ORDER — PROPOFOL 10 MG/ML IV BOLUS
INTRAVENOUS | Status: DC | PRN
  Administered 2016-05-09: 170 mg via INTRAVENOUS

## 2016-05-09 MED ORDER — SUCCINYLCHOLINE CHLORIDE 20 MG/ML IJ SOLN
INTRAMUSCULAR | Status: DC | PRN
Start: 2016-05-09 — End: 2016-05-09
  Administered 2016-05-09: 120 mg via INTRAVENOUS

## 2016-05-09 MED ORDER — GABAPENTIN 600 MG PO TABS
300.0000 mg | ORAL_TABLET | Freq: Three times a day (TID) | ORAL | Status: DC
  Administered 2016-05-09 – 2016-05-10 (×2): 300 mg via ORAL
  Filled 2016-05-09 (×3): qty 1

## 2016-05-09 MED ORDER — OXYCODONE-ACETAMINOPHEN 7.5-325 MG PO TABS: 2.0000 | ORAL_TABLET | ORAL | Status: DC | PRN

## 2016-05-09 MED ORDER — ROCURONIUM BROMIDE 50 MG/5ML IV SOLN
INTRAVENOUS | Status: AC
  Filled 2016-05-09: qty 1

## 2016-05-09 MED ORDER — SUCCINYLCHOLINE CHLORIDE 20 MG/ML IJ SOLN
INTRAMUSCULAR | Status: AC
  Filled 2016-05-09: qty 1

## 2016-05-09 MED ORDER — DEXAMETHASONE SODIUM PHOSPHATE 10 MG/ML IJ SOLN
INTRAMUSCULAR | Status: DC | PRN
  Administered 2016-05-09: 10 mg via INTRAVENOUS

## 2016-05-09 MED ORDER — FENTANYL CITRATE (PF) 100 MCG/2ML IJ SOLN
INTRAMUSCULAR | Status: DC | PRN
  Administered 2016-05-09: 100 ug via INTRAVENOUS
  Administered 2016-05-09: 50 ug via INTRAVENOUS
  Administered 2016-05-09: 100 ug via INTRAVENOUS

## 2016-05-09 MED ORDER — ONDANSETRON HCL 4 MG/2ML IJ SOLN
INTRAMUSCULAR | Status: AC
  Administered 2016-05-09: 4 mg via INTRAVENOUS
  Filled 2016-05-09: qty 2

## 2016-05-09 MED ORDER — MIDAZOLAM HCL 2 MG/2ML IJ SOLN
INTRAMUSCULAR | Status: DC | PRN
  Administered 2016-05-09: 2 mg via INTRAVENOUS

## 2016-05-09 MED ORDER — CEFAZOLIN SODIUM-DEXTROSE 2-3 GM-% IV SOLR
INTRAVENOUS | Status: DC | PRN
  Administered 2016-05-09: 2 g via INTRAVENOUS

## 2016-05-09 MED ORDER — INSULIN ASPART 100 UNIT/ML ~~LOC~~ SOLN
SUBCUTANEOUS | Status: AC
  Filled 2016-05-09: qty 5

## 2016-05-09 MED ORDER — PHENYLEPHRINE HCL 10 MG/ML IJ SOLN
INTRAMUSCULAR | Status: AC
  Filled 2016-05-09: qty 1

## 2016-05-09 MED ORDER — KETOROLAC TROMETHAMINE 30 MG/ML IJ SOLN
30.0000 mg | Freq: Four times a day (QID) | INTRAMUSCULAR | Status: DC
  Filled 2016-05-09: qty 1

## 2016-05-09 SURGICAL SUPPLY — 46 items
APPLICATOR COTTON TIP 6IN STRL (MISCELLANEOUS) ×3 IMPLANT
APPLIER CLIP 5 13 M/L LIGAMAX5 (MISCELLANEOUS) ×3
BLADE SURG 15 STRL LF DISP TIS (BLADE) ×1 IMPLANT
BLADE SURG 15 STRL SS (BLADE) ×2
CANISTER SUCT 1200ML W/VALVE (MISCELLANEOUS) ×3 IMPLANT
CHLORAPREP W/TINT 26ML (MISCELLANEOUS) ×3 IMPLANT
CHOLANGIOGRAM CATH TAUT (CATHETERS) IMPLANT
CLEANER CAUTERY TIP 5X5 PAD (MISCELLANEOUS) ×1 IMPLANT
CLIP APPLIE 5 13 M/L LIGAMAX5 (MISCELLANEOUS) ×1 IMPLANT
DECANTER SPIKE VIAL GLASS SM (MISCELLANEOUS) ×3 IMPLANT
DEVICE TROCAR PUNCTURE CLOSURE (ENDOMECHANICALS) ×3 IMPLANT
DRAPE C-ARM XRAY 36X54 (DRAPES) IMPLANT
ELECT CAUTERY BLADE 6.4 (BLADE) ×3 IMPLANT
ELECT REM PT RETURN 9FT ADLT (ELECTROSURGICAL) ×3
ELECTRODE REM PT RTRN 9FT ADLT (ELECTROSURGICAL) ×1 IMPLANT
ENDOPOUCH RETRIEVER 10 (MISCELLANEOUS) ×3 IMPLANT
GLOVE BIO SURGEON STRL SZ7 (GLOVE) ×3 IMPLANT
GOWN STRL REUS W/ TWL LRG LVL3 (GOWN DISPOSABLE) ×3 IMPLANT
GOWN STRL REUS W/TWL LRG LVL3 (GOWN DISPOSABLE) ×6
IRRIGATION STRYKERFLOW (MISCELLANEOUS) ×1 IMPLANT
IRRIGATOR STRYKERFLOW (MISCELLANEOUS) ×3
IV CATH ANGIO 12GX3 LT BLUE (NEEDLE) IMPLANT
IV NS 1000ML (IV SOLUTION) ×2
IV NS 1000ML BAXH (IV SOLUTION) ×1 IMPLANT
L-HOOK LAP DISP 36CM (ELECTROSURGICAL) ×3
LHOOK LAP DISP 36CM (ELECTROSURGICAL) ×1 IMPLANT
LIQUID BAND (GAUZE/BANDAGES/DRESSINGS) ×3 IMPLANT
NEEDLE HYPO 22GX1.5 SAFETY (NEEDLE) ×3 IMPLANT
PACK LAP CHOLECYSTECTOMY (MISCELLANEOUS) ×3 IMPLANT
PAD CLEANER CAUTERY TIP 5X5 (MISCELLANEOUS) ×2
PENCIL ELECTRO HAND CTR (MISCELLANEOUS) ×3 IMPLANT
SCISSORS METZENBAUM CVD 33 (INSTRUMENTS) ×3 IMPLANT
SLEEVE ENDOPATH XCEL 5M (ENDOMECHANICALS) ×6 IMPLANT
SOL ANTI-FOG 6CC FOG-OUT (MISCELLANEOUS) ×1 IMPLANT
SOL FOG-OUT ANTI-FOG 6CC (MISCELLANEOUS) ×2
SPONGE LAP 18X18 5 PK (GAUZE/BANDAGES/DRESSINGS) ×3 IMPLANT
STOPCOCK 3 WAY  REPLAC (MISCELLANEOUS) IMPLANT
SUT ETHIBOND 0 MO6 C/R (SUTURE) IMPLANT
SUT MNCRL AB 4-0 PS2 18 (SUTURE) ×3 IMPLANT
SUT VIC AB 0 CT2 27 (SUTURE) IMPLANT
SUT VICRYL 0 AB UR-6 (SUTURE) ×6 IMPLANT
SYR 20CC LL (SYRINGE) ×3 IMPLANT
TROCAR XCEL BLUNT TIP 100MML (ENDOMECHANICALS) ×3 IMPLANT
TROCAR XCEL NON-BLD 5MMX100MML (ENDOMECHANICALS) ×3 IMPLANT
TUBING INSUFFLATOR HI FLOW (MISCELLANEOUS) ×3 IMPLANT
WATER STERILE IRR 1000ML POUR (IV SOLUTION) ×3 IMPLANT

## 2016-05-09 NOTE — Progress Notes (Addendum)
Inpatient Diabetes Program Recommendations  AACE/ADA: New Consensus Statement on Inpatient Glycemic Control (2015)  Target Ranges:  Prepandial:   less than 140 mg/dL      Peak postprandial:   less than 180 mg/dL (1-2 hours)      Critically ill patients:  140 - 180 mg/dL   Results for TYKERRIA, MCCUBBINS (MRN [id number]) as of 05/09/2016 08:40  Ref. Range 05/08/2016 07:40 05/08/2016 12:17 05/08/2016 17:38 05/08/2016 21:32 05/09/2016 07:27  Glucose-Capillary Latest Ref Range: 65 - 99 mg/dL 184 (H) 288 (H) 307 (H) 223 (H) 232 (H)   Review of Glycemic Control  Diabetes history: DM2 Outpatient Diabetes medications: Levemir 40 units QHS, Novolog 10 units Breakfast, Novolog 10 units Lunch, Novolog 20 units Dinner Current orders for Inpatient glycemic control: Levemir 24 units QHS, Novolog 0-15 units TID with meals, Novolog 0-5 units QHS  Inpatient Diabetes Program Recommendations: Insulin - Basal: Please consider increasing Levemir to 30 units QHS. Insulin - Meal Coverage: Once diet is resumed, if post prandial glucose is consistently elevated may want to order Novolog meal coverage.  Thanks, Barnie Alderman, RN, MSN, CDE Diabetes Coordinator Inpatient Diabetes Program [phone] (Team Pager from 8am to 5pm)

## 2016-05-09 NOTE — Op Note (Signed)
Laparoscopic Cholecystectomy  Pre-operative Diagnosis: Biliary dyskinesia  Post-operative Diagnosis: Same  Procedure: Laparoscopic cholecystectomy  Surgeon: Caroleen Hamman, MD FACS  Anesthesia: Gen. with endotracheal tube  Findings: GB Ventral hernia Adhesions omentum to abd wall  Estimated Blood Loss: 20cc                 Specimens: Gallbladder           Complications: none  Procedure Details  The patient was seen again in the Holding Room. The benefits, complications, treatment options, and expected outcomes were discussed with the patient. The risks of bleeding, infection, recurrence of symptoms, failure to resolve symptoms, bile duct damage, bile duct leak, retained common bile duct stone, bowel injury, any of which could require further surgery and/or ERCP, stent, or papillotomy were reviewed with the patient. The likelihood of improving the patient's symptoms with return to their baseline status is good.  The patient and/or family concurred with the proposed plan, giving informed consent.  The patient was taken to Operating Room, identified as Michaela Tucker and the procedure verified as Laparoscopic Cholecystectomy.  A Time Out was held and the above information confirmed.  Prior to the induction of general anesthesia, antibiotic prophylaxis was administered. VTE prophylaxis was in place. General endotracheal anesthesia was then administered and tolerated well. After the induction, the abdomen was prepped with Chloraprep and draped in the sterile fashion. The patient was positioned in the supine position.  Local anesthetic  was injected into the skin near the umbilicus and an incision made. Cut down technique was used to enter the abdominal cavity and a Hasson trochar was placed after two vicryl stitches were anchored to the fascia. I performed the first trochar in the subxiphoid area due to previous laparotomy scar. Pneumoperitoneum was then created with CO2 and tolerated well  without any adverse changes in the patient's vital signs.  Two 5-mm ports were placed  quadrant all under direct vision.  There was significant adhesions in the midline in the suprapubic area and I visualized a small ventral hernia. We lysis of adhesions with electrocautery so we can place a third 5 mm port to the left of the midline away from the ventral hernia.  The patient was positioned  in reverse Trendelenburg, tilted slightly to the patient's left.  The gallbladder was identified, the fundus grasped and retracted cephalad. Adhesions were lysed bluntly. The infundibulum was grasped and retracted laterally, exposing the peritoneum overlying the triangle of Calot. This was then divided and exposed in a blunt fashion. An extended critical view of the cystic duct and cystic artery was obtained.  The cystic duct was clearly identified and bluntly dissected.   Artery and duct were double clipped and divided.  The gallbladder was taken from the gallbladder fossa in a retrograde fashion with the electrocautery. The gallbladder was removed and placed in an Endocatch bag. The liver bed was irrigated and inspected. Hemostasis was achieved with the electrocautery. Copious irrigation was utilized and was repeatedly aspirated until clear.  The gallbladder and Endocatch sac were then removed through the epigastric port site.    Inspection of the right upper quadrant was performed. No bleeding, bile duct injury or leak, or bowel injury was noted. Pneumoperitoneum was released.  The periumbilical port site was closed with figure-of-eight 0 Vicryl sutures. 4-0 subcuticular Monocryl was used to close the skin. Dermabond was  applied.  The patient was then extubated and brought to the recovery room in stable condition. Sponge, lap, and needle  counts were correct at closure and at the conclusion of the case.               Caroleen Hamman, MD, FACS

## 2016-05-09 NOTE — Anesthesia Procedure Notes (Signed)
Procedure Name: Intubation Date/Time: 05/09/2016 11:32 AM Performed by: Jennette Bill Pre-anesthesia Checklist: Patient identified, Patient being monitored, Timeout performed, Emergency Drugs available and Suction available Patient Re-evaluated:Patient Re-evaluated prior to inductionOxygen Delivery Method: Circle system utilized Preoxygenation: Pre-oxygenation with 100% oxygen Intubation Type: IV induction Ventilation: Mask ventilation without difficulty Laryngoscope Size: Mac and 3 Grade View: Grade II Tube type: Oral Tube size: 7.0 mm Number of attempts: 1 Airway Equipment and Method: Stylet Placement Confirmation: ETT inserted through vocal cords under direct vision,  positive ETCO2 and breath sounds checked- equal and bilateral Secured at: 21 cm Tube secured with: Tape Dental Injury: Teeth and Oropharynx as per pre-operative assessment

## 2016-05-09 NOTE — Anesthesia Postprocedure Evaluation (Signed)
Anesthesia Post Note  Patient: Michaela Tucker  Procedure(s) Performed: Procedure(s) (LRB): LAPAROSCOPIC CHOLECYSTECTOMY (N/A)  Patient location during evaluation: PACU Anesthesia Type: General Level of consciousness: oriented and awake and alert Pain management: pain level controlled Vital Signs Assessment: post-procedure vital signs reviewed and stable Respiratory status: spontaneous breathing Cardiovascular status: blood pressure returned to baseline Anesthetic complications: no     Last Vitals:  Vitals:   05/09/16 1456 05/09/16 1600  BP: 136/71 133/77  Pulse: 82 84  Resp: 15 20  Temp: 36.8 C 36.5 C    Last Pain:  Vitals:   05/09/16 1600  TempSrc: Oral  PainSc:                  Daniyal Tabor

## 2016-05-09 NOTE — Anesthesia Preprocedure Evaluation (Addendum)
Anesthesia Evaluation  Patient identified by MRN, date of birth, ID band  Reviewed: Allergy & Precautions, NPO status   History of Anesthesia Complications (+) PROLONGED EMERGENCE and history of anesthetic complications  Airway Mallampati: III       Dental  (+) Chipped   Pulmonary asthma , sleep apnea , pneumonia, resolved,    Pulmonary exam normal        Cardiovascular hypertension, Pt. 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% and no wall motion abnormalities     Comment: skin No date: Chicken pox No date: Chronic bronchitis (HCC) No date: Complication of anesthesia     Comment: "it's harder for me to wake up cause of my               sleep apnea" (09/27/2014) No date: Hypercholesterolemia No date: OSA on CPAP No date: Pneumonia No date: Type II diabetes mellitus (HCC)  Reproductive/Obstetrics                           Anesthesia Physical Anesthesia Plan  ASA: III  Anesthesia Plan: General   Post-op Pain Management:    Induction: Intravenous  Airway Management Planned: Oral ETT  Additional Equipment:   Intra-op Plan:   Post-operative Plan: Extubation in OR  Informed Consent: I have reviewed the patients History and Physical, chart, labs and discussed the procedure including the risks, benefits and alternatives for  the proposed anesthesia with the patient or authorized representative who has indicated his/her understanding and acceptance.   Dental advisory given  Plan Discussed with: CRNA and Surgeon  Anesthesia Plan Comments:         Anesthesia Quick Evaluation

## 2016-05-09 NOTE — Progress Notes (Signed)
Preoperative Review   Patient is met in the preoperative holding area. The history is reviewed in the chart and with the patient. I personally reviewed the options and rationale as well as the risks of this procedure that have been previously discussed with the patient. All questions asked by the patient and/or family were answered to their satisfaction.  Patient agrees to proceed with this procedure at this time.  Tilley Faeth M.D. FACS   

## 2016-05-09 NOTE — Transfer of Care (Signed)
Immediate Anesthesia Transfer of Care Note  Patient: Michaela Tucker  Procedure(s) Performed: Procedure(s): LAPAROSCOPIC CHOLECYSTECTOMY (N/A)  Patient Location: PACU  Anesthesia Type:General  Level of Consciousness: awake, alert  and oriented  Airway & Oxygen Therapy: Patient Spontanous Breathing and Patient connected to face mask oxygen  Post-op Assessment: Report given to RN and Post -op Vital signs reviewed and stable  Post vital signs: Reviewed and stable  Last Vitals:  Vitals:   05/09/16 1002 05/09/16 1245  BP: (!) 151/83 (!) 165/87  Pulse: 80 85  Resp: 18 15  Temp: 36.2 C 36.3 C    Last Pain:  Vitals:   05/09/16 1002  TempSrc: Tympanic  PainSc: 2       Patients Stated Pain Goal: 1 (07/62/26 3335)  Complications: No apparent anesthesia complications

## 2016-05-09 NOTE — Anesthesia Post-op Follow-up Note (Cosign Needed)
Anesthesia QCDR form completed.        

## 2016-05-09 NOTE — Progress Notes (Signed)
[facility] [place] NAME: Michaela Tucker    MR#:  [id number]  DATE OF BIRTH:  March 29, 1955  SUBJECTIVE:   Patient is status post laparoscopic cholecystectomy today. Seen in the PACU. Still having some abdominal pain post surgery.  REVIEW OF SYSTEMS:    Review of Systems  Constitutional: Negative for chills and fever.  HENT: Negative for congestion and tinnitus.   Eyes: Negative for blurred vision and double vision.  Respiratory: Negative for cough, shortness of breath and wheezing.   Cardiovascular: Negative for chest pain, orthopnea and PND.  Gastrointestinal: Positive for abdominal pain. Negative for diarrhea, nausea and vomiting.  Genitourinary: Negative for dysuria and hematuria.  Neurological: Negative for dizziness, sensory change and focal weakness.  All other systems reviewed and are negative.   Nutrition: Carb controlled Tolerating Diet: YES Tolerating PT: Ambulatory     DRUG ALLERGIES:   Allergies  Allergen Reactions  . Codeine Phosphate Nausea Only    Can take in cough medication, but not pain medication.  . Lovastatin Other (See Comments)    REACTION: Muscle aches    VITALS:  Blood pressure 136/71, pulse 82, temperature 98.3 F (36.8 C), temperature source Oral, resp. rate 15, height 5\' 5"  (1.651 m), weight 84.8 kg (187 lb), SpO2 97 %.  PHYSICAL EXAMINATION:   Physical Exam  GENERAL:  61 y.o.-year-old patient lying in bed lethargic post-surgery in the PACU. EYES: Pupils equal, round, reactive to light and accommodation. No scleral icterus. Extraocular muscles intact.  HEENT: Head atraumatic, normocephalic. Oropharynx and nasopharynx clear.  NECK:  Supple, no jugular venous distention. No thyroid enlargement, no tenderness.  LUNGS: Normal breath sounds bilaterally, no wheezing, rales, rhonchi. No use of accessory muscles of respiration.  CARDIOVASCULAR: S1, S2 normal. No murmurs, rubs, or gallops.  ABDOMEN: Soft,  slightly tender diffusely, no rebound, rigidity, nondistended. Bowel sounds hypoactive. No organomegaly or mass.  EXTREMITIES: No cyanosis, clubbing or edema b/l.    NEUROLOGIC: Cranial nerves II through XII are intact. No focal Motor or sensory deficits b/l.   PSYCHIATRIC: The patient is alert and oriented x 3.  SKIN: No obvious rash, lesion, or ulcer.    LABORATORY PANEL:   CBC  Recent Labs Lab 05/08/16 1555  WBC 11.3*  HGB 14.3  HCT 41.7  PLT 203   ------------------------------------------------------------------------------------------------------------------  Chemistries   Recent Labs Lab 05/08/16 0012  NA 133*  K 3.8  CL 100*  CO2 26  GLUCOSE 290*  BUN 12  CREATININE 0.50  CALCIUM 9.5  AST 23  ALT 16  ALKPHOS 98  BILITOT 0.5   ------------------------------------------------------------------------------------------------------------------  Cardiac Enzymes No results for input(s): TROPONINI in the last 168 hours. ------------------------------------------------------------------------------------------------------------------  RADIOLOGY:  Ct Abdomen Pelvis W Contrast  Result Date: 05/08/2016 CLINICAL DATA:  Right upper and right lower quadrant pain.  Nausea. EXAM: CT ABDOMEN AND PELVIS WITH CONTRAST TECHNIQUE: Multidetector CT imaging of the abdomen and pelvis was performed using the standard protocol following bolus administration of intravenous contrast. CONTRAST:  176mL ISOVUE-300 IOPAMIDOL (ISOVUE-300) INJECTION 61% COMPARISON:  Right upper quadrant ultrasound earlier this day. FINDINGS: Lower chest: No consolidation or pleural fluid. Hepatobiliary: Decreased hepatic density consistent with steatosis. No evidence of focal lesion. Gallbladder physiologically distended, no calcified stone. No biliary dilatation. Pancreas: No ductal dilatation or inflammation. Spleen: Normal in size without focal abnormality. Adrenals/Urinary Tract: Normal adrenal glands. No  hydronephrosis or perinephric edema. Symmetric renal enhancement and excretion on delayed phase imaging. 18 mm simple cyst  in the mid right kidney. Urinary bladder is physiologically distended without wall thickening. Stomach/Bowel: Stomach is within normal limits. Appendix appears normal. No evidence of bowel wall thickening, distention, or inflammatory changes. Vascular/Lymphatic: Aortic atherosclerosis without aneurysm. No adenopathy. Reproductive: Uterus and bilateral adnexa are unremarkable. Other: No free air, free fluid, or intra-abdominal fluid collection. Small fat containing umbilical hernia. Soft tissue densities in the anterior lower abdominal wall likely related to injections. Musculoskeletal: Degenerative change in the lower lumbar spine with grade 1 anterolisthesis of L4 on L5. There are no acute or suspicious osseous abnormalities. IMPRESSION: 1. No acute abnormality.  Normal appendix. 2. Hepatic steatosis. Electronically Signed   By: Jeb Levering M.D.   On: 05/08/2016 04:25   Nm Hepato W/eject Fract  Result Date: 05/08/2016 CLINICAL DATA:  RIGHT upper quadrant and RIGHT lower quadrant abdominal pain, nausea, positive Murphy sign EXAM: NUCLEAR MEDICINE HEPATOBILIARY IMAGING WITH GALLBLADDER EF TECHNIQUE: Sequential images of the abdomen were obtained out to 60 minutes following intravenous administration of radiopharmaceutical. After oral ingestion of Ensure, gallbladder ejection fraction was determined. At 60 min, normal ejection fraction is greater than 33%. RADIOPHARMACEUTICALS:  7.87 mCi Tc-48m Choletec IV COMPARISON:  CT abdomen/pelvis and abdominal ultrasound of 05/08/2016 FINDINGS: Normal tracer extraction from bloodstream indicating normal hepatocellular function. Normal excretion of tracer into biliary tree. Gallbladder visualized at 15 min. Small bowel visualized at 45 min. No hepatic retention of tracer. Subjectively borderline decreased emptying of tracer from gallbladder  following fatty meal stimulation. Calculated gallbladder ejection fraction is 28%, abnormally low. Patient reported no symptoms following Ensure ingestion. Normal gallbladder ejection fraction following Ensure ingestion is greater than 33% at 1 hour. IMPRESSION: Patent biliary tree. Mildly decreased gallbladder ejection fraction of 28% following fatty meal stimulation. Electronically Signed   By: Lavonia Dana M.D.   On: 05/08/2016 12:59   US Abdomen Limited Ruq  Result Date: 05/08/2016 CLINICAL DATA:  61 y/o  F; nausea and right upper quadrant pain. EXAM: US ABDOMEN LIMITED - RIGHT UPPER QUADRANT COMPARISON:  01/16/2016 right upper quadrant ultrasound. FINDINGS: Gallbladder: No gallstones or wall thickening visualized. Positive sonographic Murphy sign. Common bile duct: Diameter: 4.8 mm Liver: Increased liver echogenicity compatible with steatosis. Right kidney interpolar anechoic structure with enhanced through transmission measuring up to 1.6 cm compatible with a simple cyst. IMPRESSION: 1. Positive sonographic Murphy sign, very tender right upper quadrant under ultrasound pressure. No secondary signs of acute cholecystitis. No bile duct dilatation. 2. Hepatic steatosis. Electronically Signed   By: Kristine Garbe M.D.   On: 05/08/2016 03:33     ASSESSMENT AND PLAN:   61 year old female with past medical history of diabetes, hypertension, hyperlipidemia, obstructive sleep apnea, anxiety, osteoarthritis who presented to the hospital due to right upper quadrant abdominal pain.  1. Abdominal pain-etiology unclear suspected to be biliary colic but extensive workup has been negative. -Abdominal ultrasound showing positive Murphy sign but no evidence of cholelithiasis. HIDA scan also negative for any acute cholecystitis. -Seen by general surgery after further discussion with the patient and given her low ejection fraction on the HIDA scan patient opted for surgery and is status post laparoscopic  cholecystectomy today. -Continue supportive care with pain control and further care as per general surgery  2. Diabetes type 2 without complication-Levemir, sliding scale insulin. - follow BS  3. Essential hypertension-continue losartan.  4. Depression-continue Celexa  5. Anxiety-continue Xanax  6. Hyperlipidemia - cont. Crestor.   7. GERD - Protonix.   Pt transferred to surgical service. Discussed w/  Dr. Dahlia Byes and will follow along with him.   All the records are reviewed and case discussed with Care Management/Social Worker. Management plans discussed with the patient, family and they are in agreement.  CODE STATUS: Full code  DVT Prophylaxis: Ambulatory  TOTAL TIME TAKING CARE OF THIS PATIENT: 25 minutes.   POSSIBLE D/C IN 1-2 DAYS, DEPENDING ON CLINICAL CONDITION.   Henreitta Leber M.D on 05/09/2016 at 3:36 PM  Between 7am to 6pm - Pager - [phone]  After 6pm go to [email or web address] - Proofreader  [facility]  Office  [phone]  CC: Primary care physician; Coral Spikes, DO

## 2016-05-10 ENCOUNTER — Encounter: Payer: Self-pay | Admitting: Surgery

## 2016-05-10 LAB — GLUCOSE, CAPILLARY: Glucose-Capillary: 218 mg/dL — ABNORMAL HIGH (ref 65–99)

## 2016-05-10 LAB — SURGICAL PATHOLOGY

## 2016-05-10 MED ORDER — TRAMADOL HCL 50 MG PO TABS: 50.0000 mg | ORAL_TABLET | Freq: Four times a day (QID) | ORAL | 0 refills | Status: DC | PRN

## 2016-05-10 MED ORDER — ONDANSETRON HCL 4 MG PO TABS: 4.0000 mg | ORAL_TABLET | Freq: Four times a day (QID) | ORAL | 0 refills | Status: DC | PRN

## 2016-05-10 MED ORDER — INSULIN DETEMIR 100 UNIT/ML ~~LOC~~ SOLN
35.0000 [IU] | Freq: Every day | SUBCUTANEOUS | Status: DC
  Filled 2016-05-10: qty 0.35

## 2016-05-10 NOTE — Progress Notes (Signed)
[facility] [place] NAME: Michaela Tucker    MR#:  [id number]  DATE OF BIRTH:  [birth year]  SUBJECTIVE:   Patient is status post laparoscopic cholecystectomy today. Seen in the PACU. Still having some abdominal pain post surgery.  REVIEW OF SYSTEMS:    Review of Systems  Constitutional: Negative for chills and fever.  HENT: Negative for congestion and tinnitus.   Eyes: Negative for blurred vision and double vision.  Respiratory: Negative for cough, shortness of breath and wheezing.   Cardiovascular: Negative for chest pain, orthopnea and PND.  Gastrointestinal: Positive for abdominal pain. Negative for diarrhea, nausea and vomiting.  Genitourinary: Negative for dysuria and hematuria.  Neurological: Negative for dizziness, sensory change and focal weakness.  All other systems reviewed and are negative.  Nutrition: Carb controlled Tolerating Diet: YES Tolerating PT: Ambulatory  DRUG ALLERGIES:   Allergies  Allergen Reactions  . Codeine Phosphate Nausea Only    Can take in cough medication, but not pain medication.  . Lovastatin Other (See Comments)    REACTION: Muscle aches    VITALS:  Blood pressure 121/61, pulse 72, temperature 97.6 F (36.4 C), temperature source Oral, resp. rate 18, height 5\' 5"  (1.651 m), weight 91.4 kg (201 lb 6.4 oz), SpO2 98 %.  PHYSICAL EXAMINATION:   Physical Exam  GENERAL:  61 y.o.-year-old patient lying in bed lethargic post-surgery in the PACU. EYES: Pupils equal, round, reactive to light and accommodation. No scleral icterus. Extraocular muscles intact.  HEENT: Head atraumatic, normocephalic. Oropharynx and nasopharynx clear.  NECK:  Supple, no jugular venous distention. No thyroid enlargement, no tenderness.  LUNGS: Normal breath sounds bilaterally, no wheezing, rales, rhonchi. No use of accessory muscles of respiration.  CARDIOVASCULAR: S1, S2 normal. No murmurs, rubs, or gallops.  ABDOMEN:  Surgical scar clean EXTREMITIES: No cyanosis, clubbing or edema b/l.    NEUROLOGIC: Cranial nerves II through XII are intact. No focal Motor or sensory deficits b/l.   PSYCHIATRIC: The patient is alert and oriented x 3.  SKIN: No obvious rash, lesion, or ulcer.    LABORATORY PANEL:   CBC  Recent Labs Lab 05/08/16 1555  WBC 11.3*  HGB 14.3  HCT 41.7  PLT 203   ------------------------------------------------------------------------------------------------------------------  Chemistries   Recent Labs Lab 05/08/16 0012  NA 133*  K 3.8  CL 100*  CO2 26  GLUCOSE 290*  BUN 12  CREATININE 0.50  CALCIUM 9.5  AST 23  ALT 16  ALKPHOS 98  BILITOT 0.5   ------------------------------------------------------------------------------------------------------------------  Cardiac Enzymes No results for input(s): TROPONINI in the last 168 hours. ------------------------------------------------------------------------------------------------------------------  RADIOLOGY:  Nm Hepato W/eject Fract  Result Date: 05/08/2016 CLINICAL DATA:  RIGHT upper quadrant and RIGHT lower quadrant abdominal pain, nausea, positive Murphy sign EXAM: NUCLEAR MEDICINE HEPATOBILIARY IMAGING WITH GALLBLADDER EF TECHNIQUE: Sequential images of the abdomen were obtained out to 60 minutes following intravenous administration of radiopharmaceutical. After oral ingestion of Ensure, gallbladder ejection fraction was determined. At 60 min, normal ejection fraction is greater than 33%. RADIOPHARMACEUTICALS:  7.87 mCi Tc-13m Choletec IV COMPARISON:  CT abdomen/pelvis and abdominal ultrasound of 05/08/2016 FINDINGS: Normal tracer extraction from bloodstream indicating normal hepatocellular function. Normal excretion of tracer into biliary tree. Gallbladder visualized at 15 min. Small bowel visualized at 45 min. No hepatic retention of tracer. Subjectively borderline decreased emptying of tracer from gallbladder  following fatty meal stimulation. Calculated gallbladder ejection fraction is 28%, abnormally low. Patient reported no symptoms following Ensure ingestion. Normal  gallbladder ejection fraction following Ensure ingestion is greater than 33% at 1 hour. IMPRESSION: Patent biliary tree. Mildly decreased gallbladder ejection fraction of 28% following fatty meal stimulation. Electronically Signed   By: Lavonia Dana M.D.   On: 05/08/2016 12:59     ASSESSMENT AND PLAN:   61 year old female with past medical history of diabetes, hypertension, hyperlipidemia, obstructive sleep apnea, anxiety, osteoarthritis who presented to the hospital due to right upper quadrant abdominal pain.  1. Abdominal pain-etiology unclear suspected to be biliary colic but extensive workup has been negative. -Abdominal ultrasound showing positive Murphy sign but no evidence of cholelithiasis. HIDA scan also negative for any acute cholecystitis. -Seen by general surgery after further discussion with the patient and given her low ejection fraction on the HIDA scan patient opted for surgery and is status post laparoscopic cholecystectomy. Feels well Had a BM. Tolerating diet  Discussed with Dr. Jamal Collin  2. Diabetes type 2 without complication-Levemir  3. Essential hypertension-continue losartan.  4. Depression-continue Celexa  5. Anxiety-continue Xanax  6. Hyperlipidemia - cont. Crestor.   7. GERD - Protonix.   All the records are reviewed and case discussed with Care Management/Social Worker. Management plans discussed with the patient, family and they are in agreement.  CODE STATUS: Full code  DVT Prophylaxis: Ambulatory  TOTAL TIME TAKING CARE OF THIS PATIENT: 25 minutes.   D/C today.   Hillary Bow R M.D on 05/10/2016 at 12:04 PM  Between 7am to 6pm - Pager - [phone]  After 6pm go to [email or web address] - Proofreader  [facility]  Office  [phone]  CC: Primary care  physician; Coral Spikes, DO

## 2016-05-10 NOTE — Progress Notes (Signed)
Discharge instructions reviewed with the patient.  Pt has rx for ultram.  Sent out via wheelchair with belongings

## 2016-05-11 NOTE — Discharge Summary (Signed)
This 61 year old female was admitted by the medical service with a complaint of right upper quadrant abdominal pain. A CT of the abdomen showed a mildly distended gallbladder without any stones. Subsequent HIDA scan showed a low ejection fraction of 28%. Her initial white count was 12,500 and liver functions were normal. Because of persistent pain surgical consultation was obtained. Patient subsequently underwent laparoscopy and cholecystectomy on 05/09/2016. Postoperative course was essentially uncomplicated A day after surgery the patient was noted to be in stable condition with the no further pain upper quadrant other than for the expected postoperative discomfort. She was discharged home at that time in stable condition  Date of admission 05/08/2016 Date of discharge 05/10/2016  Final diagnosis biliary dyskinesia

## 2016-05-13 ENCOUNTER — Telehealth: Payer: Self-pay

## 2016-05-13 NOTE — Telephone Encounter (Signed)
Patient is wanting a return back to work letter. She would like to return to work on 04/16/2016. Patient understands that she will have a restriction of 10 lbs for the next 4 weeks.   The work letter is to be faxed to [employer] #: [phone] Company phone number is [phone]

## 2016-05-14 ENCOUNTER — Telehealth: Payer: Self-pay

## 2016-05-14 ENCOUNTER — Other Ambulatory Visit: Payer: Self-pay

## 2016-05-14 NOTE — Telephone Encounter (Signed)
Trich from [employer] contacted [place] because the dates on the patients return to work day was wrong. I went ahead and fixed that information.   The letter was re-faxed to Nori Riis at [employer] phone number [phone] Fax number [phone] with a confirmed fax transmission.   Patient has been contacted and the situation as been explained to her.

## 2016-05-14 NOTE — Telephone Encounter (Signed)
A return to work note has been sent to [employer] (Patients Human Resource) phone # [phone] Faxed to [phone] and received a Sent confirmation.

## 2016-05-14 NOTE — Telephone Encounter (Signed)
Return to work note faxed over at this time to [facility] [phone].

## 2016-05-16 ENCOUNTER — Telehealth: Payer: Self-pay

## 2016-05-16 NOTE — Telephone Encounter (Signed)
Patient is calling upset again because her Human Resource Department is stating that the paperwork sent to her was incorrect, so she is unable to return back to work tomorrow. I called Trich from her [employer]. There was a confusion on her behalf, the paperwork is correct. Patient is to return back to work on 05/17/16. Her next appointment with [place] is on 05/23/16. She is on a weight restriction until 06/19/16. Patient can not drive if she takes her narcotics.   Trich understood and had no other questions for me. I called the patient back and explained the situation to her. Patient was advised to call me back if she had any questions

## 2016-05-20 ENCOUNTER — Encounter: Payer: BLUE CROSS/BLUE SHIELD | Admitting: Surgery

## 2016-05-23 ENCOUNTER — Encounter: Payer: BLUE CROSS/BLUE SHIELD | Admitting: General Surgery

## 2016-05-24 ENCOUNTER — Ambulatory Visit: Payer: BLUE CROSS/BLUE SHIELD | Admitting: Endocrinology

## 2016-05-29 ENCOUNTER — Encounter: Payer: Self-pay | Admitting: Family Medicine

## 2016-05-29 ENCOUNTER — Ambulatory Visit (INDEPENDENT_AMBULATORY_CARE_PROVIDER_SITE_OTHER): Payer: BLUE CROSS/BLUE SHIELD | Admitting: Family Medicine

## 2016-05-29 ENCOUNTER — Encounter: Payer: Self-pay | Admitting: Surgery

## 2016-05-29 DIAGNOSIS — H5712 Ocular pain, left eye: Secondary | ICD-10-CM | POA: Insufficient documentation

## 2016-05-29 MED ORDER — OLOPATADINE HCL 0.2 % OP SOLN: 1.0000 [drp] | Freq: Every day | OPHTHALMIC | 0 refills | Status: DC

## 2016-05-29 NOTE — Patient Instructions (Signed)
Call [facility].  Try the allergy drop  Take care  Dr. Lacinda Axon

## 2016-05-29 NOTE — Progress Notes (Signed)
Pre visit review using our clinic review tool, if applicable. No additional management support is needed unless otherwise documented below in the visit note. 

## 2016-05-29 NOTE — Progress Notes (Signed)
   Subjective:  Patient ID: Michaela Tucker, female    DOB: 1955-05-07  Age: 61 y.o. MRN: [id number]  CC: Left eye pain, drainage  HPI:  61 year old female presents with the above complaint.  Patient reports that she has had left eye pain since Sunday. She states that she has baseline photophobia. No vision changes. No associated itching or burning. No headache. She's had some clear discharge and some intermittent redness. She states that her eye was matted shut this morning. No other associated symptoms. No other complaints or concerns at this time.  Social Hx   Social History   Social History  . Marital status: Widowed    Spouse name: N/A  . Number of children: N/A  . Years of education: N/A   Social History Main Topics  . Smoking status: Never Smoker  . Smokeless tobacco: Never Used  . Alcohol use Yes     Comment: Rarely  . Drug use: No  . Sexual activity: Yes   Other Topics Concern  . None   Social History Narrative  . None   Review of Systems  Constitutional: Negative.   Eyes: Positive for pain. Negative for itching and visual disturbance.   Objective:  BP 120/72   Pulse 92   Temp 98.4 F (36.9 C) (Oral)   Wt 216 lb 8 oz (98.2 kg)   SpO2 95%   BMI 36.03 kg/m   BP/Weight 05/29/2016 05/10/2016 8/85/0277  Systolic BP 412 878 676  Diastolic BP 72 61 720  Wt. (Lbs) 216.5 201.4 215.13  BMI 36.03 33.51 36.93    Physical Exam  Constitutional: She is oriented to person, place, and time. She appears well-developed. No distress.  Eyes:  No visible conjunctival injection. No drainage. PERRLA. EOMI.  Pulmonary/Chest: Effort normal.  Neurological: She is alert and oriented to person, place, and time.  Psychiatric: She has a normal mood and affect.  Vitals reviewed.   Lab Results  Component Value Date   WBC 11.3 (H) 05/08/2016   HGB 14.3 05/08/2016   HCT 41.7 05/08/2016   PLT 203 05/08/2016   GLUCOSE 290 (H) 05/08/2016   CHOL 179 02/08/2016   TRIG (H)  02/08/2016    626.0 Triglyceride is over 400; calculations on Lipids are invalid.   HDL 31.60 (L) 02/08/2016   LDLDIRECT 76.0 02/08/2016   ALT 16 05/08/2016   AST 23 05/08/2016   NA 133 (L) 05/08/2016   K 3.8 05/08/2016   CL 100 (L) 05/08/2016   CREATININE 0.50 05/08/2016   BUN 12 05/08/2016   CO2 26 05/08/2016   TSH 1.807 05/08/2016   HGBA1C 12.2 04/12/2016   MICROALBUR 0.5 07/29/2006    Assessment & Plan:   Problem List Items Addressed This Visit    Left eye pain    New problem.  Uncertain etiology at this time. I advised her to see ophthalmology. Given clear discharge, trial of Pataday.         Meds ordered this encounter  Medications  . Olopatadine HCl 0.2 % SOLN    Sig: Apply 1 drop to eye daily.    Dispense:  2.5 mL    Refill:  0   Follow-up: PRN  Boutte

## 2016-05-29 NOTE — Assessment & Plan Note (Signed)
New problem.  Uncertain etiology at this time. I advised her to see ophthalmology. Given clear discharge, trial of Pataday.

## 2016-06-06 ENCOUNTER — Institutional Professional Consult (permissible substitution): Payer: BLUE CROSS/BLUE SHIELD | Admitting: Internal Medicine

## 2016-06-06 ENCOUNTER — Encounter: Payer: Self-pay | Admitting: Internal Medicine

## 2016-08-16 ENCOUNTER — Telehealth: Payer: Self-pay | Admitting: Endocrinology

## 2016-08-16 NOTE — Telephone Encounter (Signed)
Please add on 07:45, 08/23/16

## 2016-08-16 NOTE — Telephone Encounter (Signed)
LM for pt to call back.

## 2016-08-16 NOTE — Telephone Encounter (Signed)
Patient called to request an appt. She states that she needs an early appt 07/12, 07/13, or 07/16. She states that she was hospitalized and needs a follow up. Ok to leave a vm @ # provided

## 2016-08-16 NOTE — Telephone Encounter (Signed)
See message and please advise. These dates are at 100%.

## 2016-08-16 NOTE — Telephone Encounter (Signed)
Michaela Tucker, Could you please contact the patient about this time and date to see if it would work for her?

## 2016-08-19 ENCOUNTER — Telehealth: Payer: Self-pay | Admitting: Endocrinology

## 2016-08-19 NOTE — Telephone Encounter (Signed)
Attempted to reach the patient. Number listed in the chart stated it was not in service at this time. Will try again at a later time.

## 2016-08-19 NOTE — Telephone Encounter (Signed)
10:15 am tomorrow

## 2016-08-19 NOTE — Telephone Encounter (Signed)
See message and please advise, Thanks!  

## 2016-08-19 NOTE — Telephone Encounter (Signed)
Patient would like to be seen before this Friday due to not feeling well. I advised patient that Dr. Loanne Drilling is booked all week and the nurse will call to advise if this is an option or not. Patient awaiting call.

## 2016-08-20 NOTE — Telephone Encounter (Signed)
Patient contacted and stated she could come on 7/12 at 800 am or tomorrow if any availabilities comes open.

## 2016-08-22 ENCOUNTER — Encounter: Payer: Self-pay | Admitting: Endocrinology

## 2016-08-22 ENCOUNTER — Ambulatory Visit (INDEPENDENT_AMBULATORY_CARE_PROVIDER_SITE_OTHER): Payer: BLUE CROSS/BLUE SHIELD | Admitting: Endocrinology

## 2016-08-22 VITALS — BP 122/76 | HR 88 | Ht 65.0 in | Wt 226.0 lb

## 2016-08-22 DIAGNOSIS — E119 Type 2 diabetes mellitus without complications: Secondary | ICD-10-CM

## 2016-08-22 LAB — POCT GLYCOSYLATED HEMOGLOBIN (HGB A1C): HEMOGLOBIN A1C: 11.6

## 2016-08-22 MED ORDER — CEPHALEXIN 500 MG PO CAPS: 500.0000 mg | ORAL_CAPSULE | Freq: Three times a day (TID) | ORAL | 0 refills | Status: DC

## 2016-08-22 MED ORDER — INSULIN ASPART 100 UNIT/ML FLEXPEN
PEN_INJECTOR | SUBCUTANEOUS | 11 refills | Status: DC
Start: 2016-08-22 — End: 2017-03-11

## 2016-08-22 NOTE — Progress Notes (Signed)
Subjective:    Patient ID: Michaela Tucker, female    DOB: November 19, 1955, 61 y.o.   MRN: [id number]  HPI Pt returns for f/u of diabetes mellitus:  DM type: Insulin-requiring type 2 Dx'ed: 4270 Complications: none Therapy: insulin since 2017 GDM: 1998 DKA: never Severe hypoglycemia: last episode was 2013.   Pancreatitis: never.   Other: she takes multiple daily injections.  Interval history: no cbg record, but states cbg's vary from 250-400.  pt reports 2 weeks of slight swelling of the left great toe, and assoc redness.  She denies hypoglycemia.  She last took steroids 3 mos ago.   Past Medical History:  Diagnosis Date  . Anxiety   . Anxiety and depression 04/25/2015  . Arthritis   . Asthma   . BBB (bundle branch block)   . Cancer ([place])    skin  . Chicken pox   . Chronic bronchitis ([place])   . Chronic cough 04/26/2015  . Complication of anesthesia    "it's harder for me to wake up cause of my sleep apnea" (09/27/2014)  . Diabetes mellitus without complication ([place]) 08/04/7626  . Essential hypertension 07/25/2006  . GERD (gastroesophageal reflux disease) 04/26/2015  . Hypercholesterolemia   . Hyperlipidemia 09/26/2014  . OSA on CPAP   . Pneumonia   . Type II diabetes mellitus ([place])     Past Surgical History:  Procedure Laterality Date  . ANTERIOR CERVICAL DECOMP/DISCECTOMY FUSION  12/2004  . BACK SURGERY    . CESAREAN SECTION  1995  . CHOLECYSTECTOMY N/A 05/09/2016   Procedure: LAPAROSCOPIC CHOLECYSTECTOMY;  Surgeon: Jules Husbands, MD;  Location: [facility] ORS;  Service: General;  Laterality: N/A;  . CYST EXCISION  1994    Social History   Social History  . Marital status: Widowed    Spouse name: N/A  . Number of children: N/A  . Years of education: N/A   Occupational History  . Not on file.   Social History Main Topics  . Smoking status: Never Smoker  . Smokeless tobacco: Never Used  . Alcohol use Yes     Comment: Rarely  . Drug use: No  . Sexual activity: Yes    Other Topics Concern  . Not on file   Social History Narrative  . No narrative on file    Current Outpatient Prescriptions on File Prior to Visit  Medication Sig Dispense Refill  . albuterol (PROVENTIL HFA;VENTOLIN HFA) 108 (90 Base) MCG/ACT inhaler Inhale 2 puffs into the lungs every 6 (six) hours as needed for wheezing or shortness of breath. 1 Inhaler 0  . ALPRAZolam (XANAX) 0.5 MG tablet Take 0.5 mg by mouth at bedtime as needed for anxiety.    Marland Kitchen aspirin EC 81 MG tablet Take 81 mg by mouth daily.    . citalopram (CELEXA) 20 MG tablet Take 1 tablet (20 mg total) by mouth daily. (Patient taking differently: Take 10 mg by mouth daily. ) 90 tablet 1  . fluticasone furoate-vilanterol (BREO ELLIPTA) 100-25 MCG/INH AEPB Inhale 1 puff into the lungs daily. 28 each 0  . furosemide (LASIX) 20 MG tablet Take 20 mg by mouth 2 (two) times daily as needed for edema. swelling     . glucose blood (BAYER CONTOUR NEXT TEST) test strip 1 each by Other route 2 (two) times daily. And lancets 2/day 100 each 12  . Insulin Detemir (LEVEMIR) 100 UNIT/ML Pen Inject 40 Units into the skin at bedtime. 5 pen 11  . losartan (COZAAR) 50 MG  tablet Take 1 tablet (50 mg total) by mouth daily. 90 tablet 3  . Olopatadine HCl 0.2 % SOLN Apply 1 drop to eye daily. 2.5 mL 0  . pantoprazole (PROTONIX) 40 MG tablet Take 1 tablet (40 mg total) by mouth daily. 90 tablet 1  . rosuvastatin (CRESTOR) 10 MG tablet Take 1 tablet (10 mg total) by mouth daily. 90 tablet 3   No current facility-administered medications on file prior to visit.     Allergies  Allergen Reactions  . Codeine Phosphate Nausea Only    Can take in cough medication, but not pain medication.  . Lovastatin Other (See Comments)    Myalgia    Family History  Problem Relation Age of Onset  . Breast cancer Cousin 50       maternal side  . Lung cancer Mother   . Lung cancer Father   . Hyperlipidemia Father   . Diabetes Father   . Diabetes Sister   .  Diabetes Brother     BP 122/76   Pulse 88   Ht 5\' 5"  (1.651 m)   Wt 226 lb (102.5 kg)   SpO2 97%   BMI 37.61 kg/m    Review of Systems She has weight gain, but no fever.  abd pain is resolved since GB surgery.  She has multiple other sxs.     Objective:   Physical Exam VITAL SIGNS:  See vs page GENERAL: no distress Pulses: dorsalis pedis intact bilat.   MSK: no deformity of the feet CV: 1+ bilat leg edema Skin:  no ulcer on the feet.  normal color and temp on the feet.  Neuro: sensation is intact to touch on the feet, but decreased from normal.   Ext: left great toe: slight erythema and swelling at the paronychial area.     Lab Results  Component Value Date   HGBA1C 11.6 08/22/2016      Assessment & Plan:  Insulin-requiring type 2 DM: ongoing poor control.  She may need a simpler regimen. Paronychial infection, new.     Patient Instructions  please continue the levemir, 40 units just at bedtime, and:  Increase the novolog to 3 times a day (just before each meal) 20-20-30 units.  Please call us next week, to tell us how the blood sugar is doing.   check your blood sugar twice a day.  vary the time of day when you check, between before the 3 meals, and at bedtime.  also check if you have symptoms of your blood sugar being too high or too low.  please keep a record of the readings and bring it to your next appointment here (or you can bring the meter itself).  You can write it on any piece of paper.  please call us sooner if your blood sugar goes below 70, or if you have a lot of readings over 200.   I have sent a prescription to your pharmacy, for an antibiotic pill.   Please come back for a follow-up appointment in 2 months.

## 2016-08-22 NOTE — Patient Instructions (Addendum)
please continue the levemir, 40 units just at bedtime, and:  Increase the novolog to 3 times a day (just before each meal) 20-20-30 units.  Please call us next week, to tell us how the blood sugar is doing.   check your blood sugar twice a day.  vary the time of day when you check, between before the 3 meals, and at bedtime.  also check if you have symptoms of your blood sugar being too high or too low.  please keep a record of the readings and bring it to your next appointment here (or you can bring the meter itself).  You can write it on any piece of paper.  please call us sooner if your blood sugar goes below 70, or if you have a lot of readings over 200.   I have sent a prescription to your pharmacy, for an antibiotic pill.   Please come back for a follow-up appointment in 2 months.

## 2016-08-23 ENCOUNTER — Encounter: Payer: Self-pay | Admitting: Family Medicine

## 2016-08-23 ENCOUNTER — Ambulatory Visit: Payer: BLUE CROSS/BLUE SHIELD | Admitting: Endocrinology

## 2016-08-23 ENCOUNTER — Ambulatory Visit (INDEPENDENT_AMBULATORY_CARE_PROVIDER_SITE_OTHER): Payer: BLUE CROSS/BLUE SHIELD | Admitting: Family Medicine

## 2016-08-23 VITALS — BP 124/84 | HR 84 | Temp 98.6°F | Wt 223.5 lb

## 2016-08-23 DIAGNOSIS — L03032 Cellulitis of left toe: Secondary | ICD-10-CM | POA: Insufficient documentation

## 2016-08-23 DIAGNOSIS — E1142 Type 2 diabetes mellitus with diabetic polyneuropathy: Secondary | ICD-10-CM | POA: Diagnosis not present

## 2016-08-23 DIAGNOSIS — E1149 Type 2 diabetes mellitus with other diabetic neurological complication: Secondary | ICD-10-CM | POA: Diagnosis not present

## 2016-08-23 DIAGNOSIS — T148XXA Other injury of unspecified body region, initial encounter: Secondary | ICD-10-CM | POA: Diagnosis not present

## 2016-08-23 DIAGNOSIS — E1140 Type 2 diabetes mellitus with diabetic neuropathy, unspecified: Secondary | ICD-10-CM | POA: Insufficient documentation

## 2016-08-23 MED ORDER — GABAPENTIN 300 MG PO CAPS: 300.0000 mg | ORAL_CAPSULE | Freq: Three times a day (TID) | ORAL | 3 refills | Status: DC

## 2016-08-23 NOTE — Assessment & Plan Note (Signed)
New problem. Blistering now resolved. Advised to wear appropriate fitting shoes. She needs to be very cognizant of this given diabetic neuropathy.

## 2016-08-23 NOTE — Assessment & Plan Note (Signed)
New problem. Starting Gabapentin.

## 2016-08-23 NOTE — Assessment & Plan Note (Signed)
New problem. Finish course of keflex. Referring to podiatry.

## 2016-08-23 NOTE — Progress Notes (Signed)
Subjective:  Patient ID: Michaela Tucker, female    DOB: November 26, 1955  Age: 61 y.o. MRN: [id number]  CC: Numbness and tingling in the feet, blisters on the feet, paronychia  HPI:  61 year old female with hypertension, hyperlipidemia, uncontrolled type 2 diabetes presents with the above complaints.  Numbness and tingling in the feet  Patient reports a 3 to 4 week history of a stinging pain in the feet.  Bilaterally.  Associated numbness.  Worse at night.  No known exacerbating or relieving factors.  No medications or interventions tried.  Blisters on the feet  Patient has had intermittent blistering of the feet associated with change in footwear.  Blisters tend to occur around bunions.  Has improved with change in footwear.  Paronychia  Patient recently cut her toenails.  When she cut the left great toenail she had some bleeding.  She subsequently developed redness around the nail bed and some drainage.  She saw her endocrinologist on 7/12. He placed her on Keflex.  No fevers or chills. She endorses compliance with Keflex. She is quite concerned about this.  Social Hx   Social History   Social History  . Marital status: Widowed    Spouse name: N/A  . Number of children: N/A  . Years of education: N/A   Social History Main Topics  . Smoking status: Never Smoker  . Smokeless tobacco: Never Used  . Alcohol use Yes     Comment: Rarely  . Drug use: No  . Sexual activity: Yes   Other Topics Concern  . None   Social History Narrative  . None    Review of Systems  Skin:       Recent blisters (feet).  Neurological: Positive for numbness.       Paresthesia.     Objective:  BP 124/84 (BP Location: Left Arm, Patient Position: Sitting, Cuff Size: Large)   Pulse 84   Temp 98.6 F (37 C) (Oral)   Wt 223 lb 8 oz (101.4 kg)   BMI 37.19 kg/m   BP/Weight 08/23/2016 08/22/2016 8/41/3244  Systolic BP 010 272 536  Diastolic BP 84 76 72  Wt. (Lbs) 223.5  226 216.5  BMI 37.19 37.61 36.03    Physical Exam  Constitutional: She is oriented to person, place, and time. She appears well-developed. No distress.  Cardiovascular: Normal rate and regular rhythm.   2/6 systolic murmur.  Pulmonary/Chest: Effort normal. She has no wheezes. She has no rales.  Musculoskeletal:  Left foot - evidence of prior wound from blister noted at the MTP joint of the left great toe. There is significant redness around the nail bed of the left great toe. Drainage noted laterally.  Neurological: She is alert and oriented to person, place, and time.  Psychiatric: She has a normal mood and affect.  Vitals reviewed.   Lab Results  Component Value Date   WBC 11.3 (H) 05/08/2016   HGB 14.3 05/08/2016   HCT 41.7 05/08/2016   PLT 203 05/08/2016   GLUCOSE 290 (H) 05/08/2016   CHOL 179 02/08/2016   TRIG (H) 02/08/2016    626.0 Triglyceride is over 400; calculations on Lipids are invalid.   HDL 31.60 (L) 02/08/2016   LDLDIRECT 76.0 02/08/2016   ALT 16 05/08/2016   AST 23 05/08/2016   NA 133 (L) 05/08/2016   K 3.8 05/08/2016   CL 100 (L) 05/08/2016   CREATININE 0.50 05/08/2016   BUN 12 05/08/2016   CO2 26 05/08/2016   TSH  1.807 05/08/2016   HGBA1C 11.6 08/22/2016   MICROALBUR 0.5 07/29/2006    Assessment & Plan:   Problem List Items Addressed This Visit      Endocrine   Diabetic neuropathy ([place])    New problem. Starting Gabapentin.      Relevant Orders   Ambulatory referral to Podiatry   DM (diabetes mellitus), type 2 with neurological complications [facility]) - Primary   Relevant Orders   Ambulatory referral to Podiatry     Musculoskeletal and Integument   Paronychia of great toe, left    New problem. Finish course of keflex. Referring to podiatry.      Relevant Orders   Ambulatory referral to Podiatry     Other   Blister    New problem. Blistering now resolved. Advised to wear appropriate fitting shoes. She needs to be very cognizant of  this given diabetic neuropathy.         Meds ordered this encounter  Medications  . gabapentin (NEURONTIN) 300 MG capsule    Sig: Take 1 capsule (300 mg total) by mouth 3 (three) times daily.    Dispense:  90 capsule    Refill:  3   Follow-up: 3 months  [place]

## 2016-08-23 NOTE — Patient Instructions (Signed)
Gabapentin as prescribed.  We will call with the podiatry referral.  Finish the keflex.  Follow up in 3 months.  Take care  Dr. Lacinda Axon

## 2016-09-16 ENCOUNTER — Encounter: Payer: Self-pay | Admitting: Family Medicine

## 2016-10-01 ENCOUNTER — Telehealth: Payer: Self-pay | Admitting: Family Medicine

## 2016-10-01 ENCOUNTER — Ambulatory Visit: Payer: Self-pay | Admitting: Podiatry

## 2016-10-07 ENCOUNTER — Telehealth: Payer: Self-pay | Admitting: Family Medicine

## 2016-10-07 NOTE — Telephone Encounter (Signed)
Pt dropped off FMLA paper work to be completed by Dr. Lacinda Axon. Paper work is in Dr. Jonathon Jordan color folder up front.

## 2016-10-07 NOTE — Telephone Encounter (Signed)
Left message for patient to call to advise Dr Lacinda Axon out of office all week

## 2016-10-08 NOTE — Telephone Encounter (Signed)
Spoke with patient ok to call at work [phone]

## 2016-10-08 NOTE — Telephone Encounter (Signed)
Paperwork in your folder for completion 

## 2016-10-08 NOTE — Telephone Encounter (Signed)
Spoke with Dr Biagio Quint he prefers Dr Lacinda Axon fills out [place] paperwork.  LM on voicemail advising patient.

## 2016-10-12 NOTE — Telephone Encounter (Signed)
I will need information regarding this.

## 2016-10-15 NOTE — Telephone Encounter (Signed)
Left voice mail to call back Dr Lacinda Axon needs to know why patient is requesting [employer] paperwork to be completed

## 2016-10-17 NOTE — Telephone Encounter (Signed)
Chronic bronchitis diagnosis , respiratory infection She has Not been out of work recently , Usually out of work 2-3 three times a year with condition, out of work 3-5 days

## 2016-10-22 DIAGNOSIS — Z0279 Encounter for issue of other medical certificate: Secondary | ICD-10-CM

## 2016-10-23 NOTE — Telephone Encounter (Signed)
Left voicemail advising [employer] paperwork completed and charge for completion.  [employer] paperwork faxed to employer.

## 2016-10-25 ENCOUNTER — Ambulatory Visit: Payer: BLUE CROSS/BLUE SHIELD | Admitting: Endocrinology

## 2016-11-11 ENCOUNTER — Other Ambulatory Visit: Payer: Self-pay | Admitting: Family Medicine

## 2016-11-12 NOTE — Telephone Encounter (Signed)
Last OV was 08/23/16 with Dr. Lacinda Axon, last fill was  In December and march, please advise for refills, thanks

## 2016-11-15 NOTE — Telephone Encounter (Signed)
Please advise for refill, patient is establishing with you. thanks

## 2016-11-18 NOTE — Telephone Encounter (Signed)
30 day of protonix as want to discuss high dose ppi with patient

## 2016-11-18 NOTE — Progress Notes (Signed)
Subjective:    Patient ID: Michaela Tucker, female    DOB: 25-Sep-1955, 61 y.o.   MRN: [id number]  CC: Michaela Tucker is a 61 y.o. female who presents today for follow up.   HPI: Complains of left low back ache' the meat is sore' x 10days, has 'eased up.' Then 5 days ago, started feeling dysuria, urinary frequency, nausea. No fever, hematuria, V, injury.   No recent utis.   GERD- never had EGD ; controlled on prilosec high dose. Non smoker  Depression-doing well on celexa.  Complains of lack of focus. Notices it more at work and now on production plan at work. Not happy with job and management. Stays very organized. Endorses financial stress. No thoughts of hurting herself or anyone else. Never tested for ADHD.   Insomnia- since husband passed away, needs prn xanax for sleep. Bad reaction to other sleeping medication. May use xanax once per week.   Would like diflucan to have on hand as gets yeast infection after sitting in pool.   Occasional hands and feet swell- takes lasix prn.   DM and neuropathy- follows with endocrine; neuropathic pain resolves with gabapentin, typically one in morning and at night; not too drowsy from medication.    Known heart murmur- no dizziness, syncope Echo 2016 - showed EF 55-60%. No stenosis, regurgitation.  HISTORY:  Past Medical History:  Diagnosis Date  . Anxiety   . Anxiety and depression 04/25/2015  . Arthritis   . Asthma   . BBB (bundle branch block)   . Cancer ([place])    skin  . Chicken pox   . Chronic bronchitis ([place])   . Chronic cough 04/26/2015  . Complication of anesthesia    "it's harder for me to wake up cause of my sleep apnea" (09/27/2014)  . Diabetes mellitus without complication ([place]) 1/93/7902  . Essential hypertension 07/25/2006  . GERD (gastroesophageal reflux disease) 04/26/2015  . Hypercholesterolemia   . Hyperlipidemia 09/26/2014  . OSA on CPAP   . Pneumonia   . Type II diabetes mellitus ([place])    Past Surgical  History:  Procedure Laterality Date  . ANTERIOR CERVICAL DECOMP/DISCECTOMY FUSION  12/2004  . BACK SURGERY    . CESAREAN SECTION  1995  . CHOLECYSTECTOMY N/A 05/09/2016   Procedure: LAPAROSCOPIC CHOLECYSTECTOMY;  Surgeon: Jules Husbands, MD;  Location: [facility] ORS;  Service: General;  Laterality: N/A;  . CYST EXCISION  1994   Family History  Problem Relation Age of Onset  . Breast cancer Cousin 50       maternal side  . Lung cancer Mother   . Lung cancer Father   . Hyperlipidemia Father   . Diabetes Father   . Diabetes Sister   . Diabetes Brother     Allergies: Codeine phosphate and Lovastatin Current Outpatient Prescriptions on File Prior to Visit  Medication Sig Dispense Refill  . albuterol (PROVENTIL HFA;VENTOLIN HFA) 108 (90 Base) MCG/ACT inhaler Inhale 2 puffs into the lungs every 6 (six) hours as needed for wheezing or shortness of breath. 1 Inhaler 0  . aspirin EC 81 MG tablet Take 81 mg by mouth daily.    . fluticasone furoate-vilanterol (BREO ELLIPTA) 100-25 MCG/INH AEPB Inhale 1 puff into the lungs daily. 28 each 0  . furosemide (LASIX) 20 MG tablet Take 20 mg by mouth 2 (two) times daily as needed for edema. swelling     . gabapentin (NEURONTIN) 300 MG capsule Take 1 capsule (300 mg total) by mouth  3 (three) times daily. 90 capsule 3  . glucose blood (BAYER CONTOUR NEXT TEST) test strip 1 each by Other route 2 (two) times daily. And lancets 2/day 100 each 12  . insulin aspart (NOVOLOG FLEXPEN) 100 UNIT/ML FlexPen 3 times a day (just before each meal) 20-20-30 units, and pen needles 4/day. 10 pen 11  . Insulin Detemir (LEVEMIR) 100 UNIT/ML Pen Inject 40 Units into the skin at bedtime. 5 pen 11  . losartan (COZAAR) 50 MG tablet Take 1 tablet (50 mg total) by mouth daily. 90 tablet 3  . pantoprazole (PROTONIX) 40 MG tablet TAKE ONE TABLET BY MOUTH DAILY 30 tablet 0  . rosuvastatin (CRESTOR) 10 MG tablet Take 1 tablet (10 mg total) by mouth daily. 90 tablet 3   No current  facility-administered medications on file prior to visit.     Social History  Substance Use Topics  . Smoking status: Never Smoker  . Smokeless tobacco: Never Used  . Alcohol use Yes     Comment: Rarely    Review of Systems  Constitutional: Negative for chills and fever.  Respiratory: Negative for cough.   Cardiovascular: Negative for chest pain and palpitations.  Gastrointestinal: Positive for nausea. Negative for abdominal pain and vomiting.  Genitourinary: Positive for dysuria and frequency. Negative for flank pain.  Musculoskeletal: Positive for back pain.  Neurological: Negative for dizziness and syncope.      Objective:    BP 130/90 (BP Location: Left Arm, Patient Position: Sitting, Cuff Size: Large)   Pulse 86   Temp 98.1 F (36.7 C) (Oral)   Wt 226 lb 2 oz (102.6 kg)   BMI 37.63 kg/m  BP Readings from Last 3 Encounters:  11/27/16 130/90  08/23/16 124/84  08/22/16 122/76   Wt Readings from Last 3 Encounters:  11/27/16 226 lb 2 oz (102.6 kg)  08/23/16 223 lb 8 oz (101.4 kg)  08/22/16 226 lb (102.5 kg)    Physical Exam  Constitutional: She appears well-developed and well-nourished.  Cardiovascular: Normal rate, regular rhythm and normal pulses.   Murmur heard.  Systolic murmur is present with a grade of 2/6  SEM II/VI, Loudest LSB, non radiating, no thrill   Pulmonary/Chest: Effort normal and breath sounds normal. She has no wheezes. She has no rhonchi. She has no rales.  Abdominal: There is no CVA tenderness.  Musculoskeletal:       Lumbar back: She exhibits normal range of motion, no tenderness, no bony tenderness, no pain and no spasm.  Neurological: She is alert.  Skin: Skin is warm and dry.  Psychiatric: She has a normal mood and affect. Her speech is normal and behavior is normal. Thought content normal.  Vitals reviewed.      Assessment & Plan:   Problem List Items Addressed This Visit      Cardiovascular and Mediastinum   Essential  hypertension    Controlled. Continue current regimen      Relevant Orders   Basic metabolic panel     Digestive   GERD (gastroesophageal reflux disease)    Chronic. Pending GI consult for EGD. Education provided on long-term risk of PPIs      Relevant Orders   Ambulatory referral to Gastroenterology     Endocrine   DM (diabetes mellitus), type 2 with neurological complications ([place])    Currently managed by endocrine. Patient will like to follow with [place] however I told her if A1c still continues to be as high as it was last time, I  would prefer her to stay with endocrine which she understood. Pending A1c. Neuropathy controlled with gabapentin.      Relevant Orders   Hemoglobin A1c     Other   Anxiety and depression    Doing well on celexa and prn use of xanax ( using sparingly). Refilled I looked up patient on [place] Controlled Substances Reporting System and saw no activity that raised concern of inappropriate use.        Relevant Medications   citalopram (CELEXA) 20 MG tablet   buPROPion (WELLBUTRIN XL) 150 MG 24 hr tablet   ALPRAZolam (XANAX) 0.5 MG tablet   Dysuria    Afebrile. Patient agreeable to wait on urine culture      Relevant Orders   Urinalysis, Routine w reflex microscopic   CULTURE, URINE COMPREHENSIVE   Heart murmur    Asymptomatic. Patient declines cardiology consult at this time.      Yeast infection    Diflucan given as needed. Interaction between celexa and diflucan however patient dose of celexa is not greater than 20mg .Patient has taken in past without any side effects.       Relevant Medications   fluconazole (DIFLUCAN) 150 MG tablet   Difficulty concentrating - Primary    trial wellbutrin. Will follow.       Relevant Medications   buPROPion (WELLBUTRIN XL) 150 MG 24 hr tablet       I have discontinued Ms. Ines's Olopatadine HCl and cephALEXin. I have also changed her citalopram and ALPRAZolam. Additionally, I am having her start on  fluconazole and buPROPion. Lastly, I am having her maintain her furosemide, albuterol, rosuvastatin, losartan, aspirin EC, glucose blood, Insulin Detemir, fluticasone furoate-vilanterol, insulin aspart, gabapentin, and pantoprazole.   Meds ordered this encounter  Medications  . fluconazole (DIFLUCAN) 150 MG tablet    Sig: Take 1 tablet (150 mg total) by mouth once. Take one tablet PO once. If continue to have symptoms, may take one tablet PO 3 days later.    Dispense:  2 tablet    Refill:  3    Order Specific Question:   Supervising Provider    Answer:   Derrel Nip, TERESA L [[id number]]  . citalopram (CELEXA) 20 MG tablet    Sig: Take 1 tablet (20 mg total) by mouth daily.    Dispense:  90 tablet    Refill:  1    Order Specific Question:   Supervising Provider    Answer:   Deborra Medina L [[id number]]  . buPROPion (WELLBUTRIN XL) 150 MG 24 hr tablet    Sig: Take 1 tablet (150 mg total) by mouth 2 (two) times daily. Start 150 mg ER PO qam, increase after 3 days to 150 mg ER PO BID.    Dispense:  60 tablet    Refill:  3    Order Specific Question:   Supervising Provider    Answer:   Deborra Medina L [[id number]]  . ALPRAZolam (XANAX) 0.5 MG tablet    Sig: Take 1 tablet (0.5 mg total) by mouth at bedtime as needed for anxiety.    Dispense:  30 tablet    Refill:  1    Order Specific Question:   Supervising Provider    Answer:   Crecencio Mc [[id number]]    Return precautions given.   Risks, benefits, and alternatives of the medications and treatment plan prescribed today were discussed, and patient expressed understanding.   Education regarding symptom management and diagnosis given to patient on AVS.  Continue to follow with Burnard Hawthorne, FNP for routine health maintenance.   Michaela Tucker and I agreed with plan.   Mable Paris, FNP

## 2016-11-26 ENCOUNTER — Ambulatory Visit: Payer: BLUE CROSS/BLUE SHIELD | Admitting: Family Medicine

## 2016-11-27 ENCOUNTER — Encounter: Payer: Self-pay | Admitting: Family

## 2016-11-27 ENCOUNTER — Ambulatory Visit (INDEPENDENT_AMBULATORY_CARE_PROVIDER_SITE_OTHER): Payer: BLUE CROSS/BLUE SHIELD | Admitting: Family

## 2016-11-27 ENCOUNTER — Telehealth: Payer: Self-pay

## 2016-11-27 VITALS — BP 130/90 | HR 86 | Temp 98.1°F | Wt 226.1 lb

## 2016-11-27 DIAGNOSIS — R41840 Attention and concentration deficit: Secondary | ICD-10-CM | POA: Insufficient documentation

## 2016-11-27 DIAGNOSIS — R300 Dysuria: Secondary | ICD-10-CM | POA: Diagnosis not present

## 2016-11-27 DIAGNOSIS — F419 Anxiety disorder, unspecified: Secondary | ICD-10-CM | POA: Diagnosis not present

## 2016-11-27 DIAGNOSIS — K219 Gastro-esophageal reflux disease without esophagitis: Secondary | ICD-10-CM | POA: Diagnosis not present

## 2016-11-27 DIAGNOSIS — I10 Essential (primary) hypertension: Secondary | ICD-10-CM

## 2016-11-27 DIAGNOSIS — E1149 Type 2 diabetes mellitus with other diabetic neurological complication: Secondary | ICD-10-CM

## 2016-11-27 DIAGNOSIS — B379 Candidiasis, unspecified: Secondary | ICD-10-CM

## 2016-11-27 DIAGNOSIS — F329 Major depressive disorder, single episode, unspecified: Secondary | ICD-10-CM

## 2016-11-27 DIAGNOSIS — R011 Cardiac murmur, unspecified: Secondary | ICD-10-CM | POA: Insufficient documentation

## 2016-11-27 LAB — BASIC METABOLIC PANEL
BUN: 16 mg/dL (ref 6–23)
CO2: 26 mEq/L (ref 19–32)
Calcium: 9.4 mg/dL (ref 8.4–10.5)
Chloride: 100 mEq/L (ref 96–112)
Creatinine, Ser: 0.55 mg/dL (ref 0.40–1.20)
GFR: 119.56 mL/min (ref >60.00)
GLUCOSE: 194 mg/dL — AB (ref 70–99)
POTASSIUM: 4 meq/L (ref 3.5–5.1)
Sodium: 137 mEq/L (ref 135–145)

## 2016-11-27 LAB — URINALYSIS, ROUTINE W REFLEX MICROSCOPIC
Bilirubin Urine: NEGATIVE
Ketones, ur: NEGATIVE
Leukocytes, UA: NEGATIVE
NITRITE: NEGATIVE
Specific Gravity, Urine: >=1.03 — AB (ref 1.000–1.030)
Urine Glucose: 250 — AB
Urobilinogen, UA: 0.2 (ref 0.0–1.0)
pH: 5.5 (ref 5.0–8.0)

## 2016-11-27 LAB — HEMOGLOBIN A1C: Hgb A1c MFr Bld: 10.4 % — ABNORMAL HIGH (ref 4.6–6.5)

## 2016-11-27 MED ORDER — FLUCONAZOLE 150 MG PO TABS
150.0000 mg | ORAL_TABLET | Freq: Once | ORAL | 3 refills | Status: AC
Start: 2016-11-27 — End: 2016-11-27

## 2016-11-27 MED ORDER — PANTOPRAZOLE SODIUM 40 MG PO TBEC: 40.0000 mg | DELAYED_RELEASE_TABLET | Freq: Every day | ORAL | 3 refills | Status: DC

## 2016-11-27 MED ORDER — FUROSEMIDE 20 MG PO TABS: 20.0000 mg | ORAL_TABLET | Freq: Two times a day (BID) | ORAL | 3 refills | Status: AC | PRN

## 2016-11-27 MED ORDER — LOSARTAN POTASSIUM 50 MG PO TABS: 50.0000 mg | ORAL_TABLET | Freq: Every day | ORAL | 0 refills | Status: DC

## 2016-11-27 MED ORDER — GABAPENTIN 300 MG PO CAPS: 300.0000 mg | ORAL_CAPSULE | Freq: Three times a day (TID) | ORAL | 3 refills | Status: DC

## 2016-11-27 MED ORDER — ALPRAZOLAM 0.5 MG PO TABS: 0.5000 mg | ORAL_TABLET | Freq: Every evening | ORAL | 1 refills | Status: AC | PRN

## 2016-11-27 MED ORDER — CITALOPRAM HYDROBROMIDE 20 MG PO TABS: 20.0000 mg | ORAL_TABLET | Freq: Every day | ORAL | 1 refills | Status: DC

## 2016-11-27 MED ORDER — ROSUVASTATIN CALCIUM 10 MG PO TABS: 10.0000 mg | ORAL_TABLET | Freq: Every day | ORAL | 0 refills | Status: DC

## 2016-11-27 MED ORDER — BUPROPION HCL ER (XL) 150 MG PO TB24: 150.0000 mg | ORAL_TABLET | Freq: Two times a day (BID) | ORAL | 3 refills | Status: AC

## 2016-11-27 NOTE — Assessment & Plan Note (Addendum)
Currently managed by endocrine. Patient will like to follow with [place] however I told her if A1c still continues to be as high as it was last time, I would prefer her to stay with endocrine which she understood. Pending A1c. Neuropathy controlled with gabapentin.

## 2016-11-27 NOTE — Addendum Note (Signed)
Addended by: Leeanne Rio on: 11/27/2016 02:20 PM   Modules accepted: Orders

## 2016-11-27 NOTE — Addendum Note (Signed)
Addended by: Arby Barrette on: 11/27/2016 09:51 AM   Modules accepted: Orders

## 2016-11-27 NOTE — Assessment & Plan Note (Signed)
trial wellbutrin. Will follow.

## 2016-11-27 NOTE — Assessment & Plan Note (Signed)
Diflucan given as needed. Interaction between celexa and diflucan however patient dose of celexa is not greater than 20mg .Patient has taken in past without any side effects.

## 2016-11-27 NOTE — Assessment & Plan Note (Signed)
Doing well on celexa and prn use of xanax ( using sparingly). Refilled I looked up patient on  Controlled Substances Reporting System and saw no activity that raised concern of inappropriate use.

## 2016-11-27 NOTE — Assessment & Plan Note (Signed)
Chronic. Pending GI consult for EGD. Education provided on long-term risk of PPIs

## 2016-11-27 NOTE — Assessment & Plan Note (Signed)
Asymptomatic. Patient declines cardiology consult at this time.

## 2016-11-27 NOTE — Assessment & Plan Note (Signed)
Controlled. Continue current regimen. 

## 2016-11-27 NOTE — Assessment & Plan Note (Signed)
Afebrile. Patient agreeable to wait on urine culture

## 2016-11-27 NOTE — Patient Instructions (Addendum)
Will await urine  Trial of wellbutrin for concentration  Long term use beyond 3 months of proton pump inhibitors , also called PPI's, is associated with malabsorption of vitamins, chronic kidney disease, fracture risk, and diarrheal illnesses. PPI's include Nexium, Prilosec, Protonix, Dexilant, and Prevacid.   I generally recommend trying to control acid reflux with lifestyle modifications including avoiding trigger foods, not eating 2 hours prior to bedtime. You may use histamine 2 blockers daily to twice daily ( this is Zantac, Pepcid) and then when symptoms flare, start back on PPI for short course.   Will order EGD  Let me know if any dizziness, shortness of breath, palpitations - as then I would consult cardiology for heart murmur  Follow up 3 months with me or colleague

## 2016-11-27 NOTE — Addendum Note (Signed)
Addended by: Arby Barrette on: 11/27/2016 02:32 PM   Modules accepted: Orders

## 2016-11-28 LAB — URINE CULTURE
MICRO NUMBER:: 81159497
RESULT: NO GROWTH
SPECIMEN QUALITY: ADEQUATE

## 2016-11-30 ENCOUNTER — Other Ambulatory Visit: Payer: Self-pay | Admitting: Family

## 2016-11-30 DIAGNOSIS — R319 Hematuria, unspecified: Secondary | ICD-10-CM

## 2016-12-03 ENCOUNTER — Telehealth: Payer: Self-pay | Admitting: Family

## 2016-12-03 NOTE — Telephone Encounter (Signed)
Pt called back returning your call. Please advise, thank you!  Call pt @ [phone] or if you call after 10 call [phone] ext [phone]

## 2016-12-09 ENCOUNTER — Ambulatory Visit: Payer: BLUE CROSS/BLUE SHIELD

## 2016-12-09 NOTE — Telephone Encounter (Signed)
Error

## 2016-12-10 ENCOUNTER — Encounter: Payer: Self-pay | Admitting: Gastroenterology

## 2017-02-17 ENCOUNTER — Other Ambulatory Visit: Payer: Self-pay | Admitting: Family Medicine

## 2017-02-17 DIAGNOSIS — Z1231 Encounter for screening mammogram for malignant neoplasm of breast: Secondary | ICD-10-CM

## 2017-03-05 ENCOUNTER — Other Ambulatory Visit: Payer: Self-pay | Admitting: Family

## 2017-03-11 ENCOUNTER — Other Ambulatory Visit: Payer: Self-pay

## 2017-03-11 ENCOUNTER — Ambulatory Visit
Admission: RE | Admit: 2017-03-11 | Discharge: 2017-03-11 | Disposition: A | Discharge status: Home / Self Care | Payer: BLUE CROSS/BLUE SHIELD | Source: Ambulatory Visit | Attending: Family Medicine | Admitting: Family Medicine

## 2017-03-11 ENCOUNTER — Ambulatory Visit: Payer: BLUE CROSS/BLUE SHIELD | Admitting: Endocrinology

## 2017-03-11 ENCOUNTER — Encounter: Payer: Self-pay | Admitting: Endocrinology

## 2017-03-11 VITALS — BP 154/78 | HR 89 | Wt 229.2 lb

## 2017-03-11 DIAGNOSIS — F329 Major depressive disorder, single episode, unspecified: Secondary | ICD-10-CM

## 2017-03-11 DIAGNOSIS — E1149 Type 2 diabetes mellitus with other diabetic neurological complication: Secondary | ICD-10-CM | POA: Diagnosis not present

## 2017-03-11 DIAGNOSIS — F419 Anxiety disorder, unspecified: Principal | ICD-10-CM

## 2017-03-11 DIAGNOSIS — R41840 Attention and concentration deficit: Secondary | ICD-10-CM

## 2017-03-11 DIAGNOSIS — Z1231 Encounter for screening mammogram for malignant neoplasm of breast: Secondary | ICD-10-CM | POA: Diagnosis not present

## 2017-03-11 LAB — POCT GLYCOSYLATED HEMOGLOBIN (HGB A1C): Hemoglobin A1C: 11.1

## 2017-03-11 MED ORDER — CITALOPRAM HYDROBROMIDE 20 MG PO TABS: 20.0000 mg | ORAL_TABLET | Freq: Every day | ORAL | 1 refills | Status: AC

## 2017-03-11 MED ORDER — GABAPENTIN 300 MG PO CAPS: 300.0000 mg | ORAL_CAPSULE | Freq: Three times a day (TID) | ORAL | 1 refills | Status: DC

## 2017-03-11 MED ORDER — INSULIN NPH ISOPHANE & REGULAR (70-30) 100 UNIT/ML ~~LOC~~ SUSP: SUBCUTANEOUS | 3 refills | Status: AC

## 2017-03-11 MED ORDER — PANTOPRAZOLE SODIUM 40 MG PO TBEC: 40.0000 mg | DELAYED_RELEASE_TABLET | Freq: Every day | ORAL | 1 refills | Status: AC

## 2017-03-11 MED ORDER — LOSARTAN POTASSIUM 50 MG PO TABS: 50.0000 mg | ORAL_TABLET | Freq: Every day | ORAL | 1 refills | Status: AC

## 2017-03-11 MED ORDER — ROSUVASTATIN CALCIUM 10 MG PO TABS: 10.0000 mg | ORAL_TABLET | Freq: Every day | ORAL | 1 refills | Status: DC

## 2017-03-11 NOTE — Patient Instructions (Addendum)
I have sent a prescription to your pharmacy, to change both insulins to "70/30," twice a day.   Please call us next week, to tell us how the blood sugar is doing.   check your blood sugar once a day.  vary the time of day when you check, between before the 3 meals, and at bedtime.  also check if you have symptoms of your blood sugar being too high or too low.  please keep a record of the readings and bring it to your next appointment here (or you can bring the meter itself).  You can write it on any piece of paper.  please call us sooner if your blood sugar goes below 70, or if you have a lot of readings over 200.    Please come back for a follow-up appointment in 4 months.

## 2017-03-11 NOTE — Progress Notes (Signed)
Subjective:    Patient ID: Michaela Tucker, female    DOB: [birth year], 62 y.o.   MRN: [id number]  HPI Pt returns for f/u of diabetes mellitus:  DM type: Insulin-requiring type 2 Dx'ed: 0488 Complications: none Therapy: insulin since 2017.  GDM: 1998 DKA: never.   Severe hypoglycemia: last episode was 2013.   Pancreatitis: never.   Other: she takes multiple daily injections;    Interval history: She does not miss the insulin.  she was just laid off from her job, and loses health ins in 2 days.  She took course or oral steroids 1 month ago.  no cbg record, but states cbg's are in the 200's, even long after the steroids were finished.   Past Medical History:  Diagnosis Date  . Anxiety   . Anxiety and depression 04/25/2015  . Arthritis   . Asthma   . BBB (bundle branch block)   . Cancer ([place])    skin  . Chicken pox   . Chronic bronchitis ([place])   . Chronic cough 04/26/2015  . Complication of anesthesia    "it's harder for me to wake up cause of my sleep apnea" (09/27/2014)  . Diabetes mellitus without complication ([place]) 8/91/6945  . Essential hypertension 07/25/2006  . GERD (gastroesophageal reflux disease) 04/26/2015  . Hypercholesterolemia   . Hyperlipidemia 09/26/2014  . OSA on CPAP   . Pneumonia   . Type II diabetes mellitus ([place])     Past Surgical History:  Procedure Laterality Date  . ANTERIOR CERVICAL DECOMP/DISCECTOMY FUSION  12/2004  . BACK SURGERY    . CESAREAN SECTION  1995  . CHOLECYSTECTOMY N/A 05/09/2016   Procedure: LAPAROSCOPIC CHOLECYSTECTOMY;  Surgeon: Jules Husbands, MD;  Location: [facility] ORS;  Service: General;  Laterality: N/A;  . CYST EXCISION  1994    Social History   Socioeconomic History  . Marital status: Widowed    Spouse name: Not on file  . Number of children: Not on file  . Years of education: Not on file  . Highest education level: Not on file  Social Needs  . Financial resource strain: Not on file  . Food insecurity - worry: Not on file    . Food insecurity - inability: Not on file  . Transportation needs - medical: Not on file  . Transportation needs - non-medical: Not on file  Occupational History  . Not on file  Tobacco Use  . Smoking status: Never Smoker  . Smokeless tobacco: Never Used  Substance and Sexual Activity  . Alcohol use: Yes    Comment: Rarely  . Drug use: No  . Sexual activity: Yes  Other Topics Concern  . Not on file  Social History Narrative   Works in Therapist, art     Current Outpatient Medications on File Prior to Visit  Medication Sig Dispense Refill  . albuterol (PROVENTIL HFA;VENTOLIN HFA) 108 (90 Base) MCG/ACT inhaler Inhale 2 puffs into the lungs every 6 (six) hours as needed for wheezing or shortness of breath. 1 Inhaler 0  . ALPRAZolam (XANAX) 0.5 MG tablet Take 1 tablet (0.5 mg total) by mouth at bedtime as needed for anxiety. 30 tablet 1  . aspirin EC 81 MG tablet Take 81 mg by mouth daily.    Marland Kitchen buPROPion (WELLBUTRIN XL) 150 MG 24 hr tablet Take 1 tablet (150 mg total) by mouth 2 (two) times daily. Start 150 mg ER PO qam, increase after 3 days to 150 mg ER PO BID. [place]  tablet 3  . fluticasone furoate-vilanterol (BREO ELLIPTA) 100-25 MCG/INH AEPB Inhale 1 puff into the lungs daily. 28 each 0  . furosemide (LASIX) 20 MG tablet Take 1 tablet (20 mg total) by mouth 2 (two) times daily as needed for edema. swelling 60 tablet 3  . glucose blood (BAYER CONTOUR NEXT TEST) test strip 1 each by Other route 2 (two) times daily. And lancets 2/day 100 each 12   No current facility-administered medications on file prior to visit.     Allergies  Allergen Reactions  . Codeine Phosphate Nausea Only    Can take in cough medication, but not pain medication.  . Lovastatin Other (See Comments)    Myalgia    Family History  Problem Relation Age of Onset  . Breast cancer Cousin 50       maternal side  . Lung cancer Mother   . Lung cancer Father   . Hyperlipidemia Father   . Diabetes Father   .  Diabetes Sister   . Diabetes Brother     BP (!) 154/78 (BP Location: Left Arm, Patient Position: Sitting, Cuff Size: Normal)   Pulse 89   Wt 229 lb 3.2 oz (104 kg)   SpO2 97%   BMI 38.14 kg/m    Review of Systems She denies hypoglycemia    Objective:   Physical Exam VITAL SIGNS:  See vs page GENERAL: no distress Pulses: dorsalis pedis intact bilat.   MSK: no deformity of the feet CV: trace bilat leg .   Skin:  no ulcer on the feet.  normal color and temp on the feet.  Neuro: sensation is intact to touch on the feet, but decreased from normal.    A1c=11.1%    Assessment & Plan:  Insulin-requiring type 2 DM: worse.    Patient Instructions  I have sent a prescription to your pharmacy, to change both insulins to "70/30," twice a day.   Please call us next week, to tell us how the blood sugar is doing.   check your blood sugar once a day.  vary the time of day when you check, between before the 3 meals, and at bedtime.  also check if you have symptoms of your blood sugar being too high or too low.  please keep a record of the readings and bring it to your next appointment here (or you can bring the meter itself).  You can write it on any piece of paper.  please call us sooner if your blood sugar goes below 70, or if you have a lot of readings over 200.    Please come back for a follow-up appointment in 4 months.

## 2017-03-12 ENCOUNTER — Ambulatory Visit: Payer: BLUE CROSS/BLUE SHIELD | Admitting: Family Medicine

## 2017-05-07 ENCOUNTER — Ambulatory Visit: Payer: BLUE CROSS/BLUE SHIELD | Admitting: Family

## 2017-05-07 DIAGNOSIS — Z0289 Encounter for other administrative examinations: Secondary | ICD-10-CM

## 2017-07-02 ENCOUNTER — Encounter: Payer: Self-pay | Admitting: Family

## 2017-07-02 ENCOUNTER — Other Ambulatory Visit: Payer: Self-pay | Admitting: Family

## 2017-07-02 ENCOUNTER — Ambulatory Visit: Payer: Self-pay | Admitting: Family

## 2017-07-02 DIAGNOSIS — E785 Hyperlipidemia, unspecified: Secondary | ICD-10-CM

## 2017-07-02 DIAGNOSIS — Z0289 Encounter for other administrative examinations: Secondary | ICD-10-CM

## 2017-07-02 MED ORDER — ROSUVASTATIN CALCIUM 10 MG PO TABS: 10.0000 mg | ORAL_TABLET | Freq: Every day | ORAL | 1 refills | Status: AC

## 2017-07-02 MED ORDER — GABAPENTIN 300 MG PO CAPS: 300.0000 mg | ORAL_CAPSULE | Freq: Three times a day (TID) | ORAL | 1 refills | Status: AC

## 2017-07-02 NOTE — Progress Notes (Signed)
Refilled at patients request - she spoke with vanessa

## 2017-07-09 ENCOUNTER — Ambulatory Visit: Payer: BLUE CROSS/BLUE SHIELD | Admitting: Endocrinology

## 2017-07-09 ENCOUNTER — Encounter: Payer: Self-pay | Admitting: Family

## 2017-07-09 ENCOUNTER — Ambulatory Visit: Payer: Self-pay | Admitting: Family

## 2017-07-09 DIAGNOSIS — Z0289 Encounter for other administrative examinations: Secondary | ICD-10-CM

## 2017-08-23 IMAGING — MG MM DIGITAL SCREENING BILAT W/ TOMO W/ CAD
8 of 12 series · 8 of 28 positions shown · non-contrast
Comparison: Previous exam(s).

CLINICAL DATA: Screening.

EXAM:
2D DIGITAL SCREENING BILATERAL MAMMOGRAM WITH CAD AND ADJUNCT TOMO

[R CC]
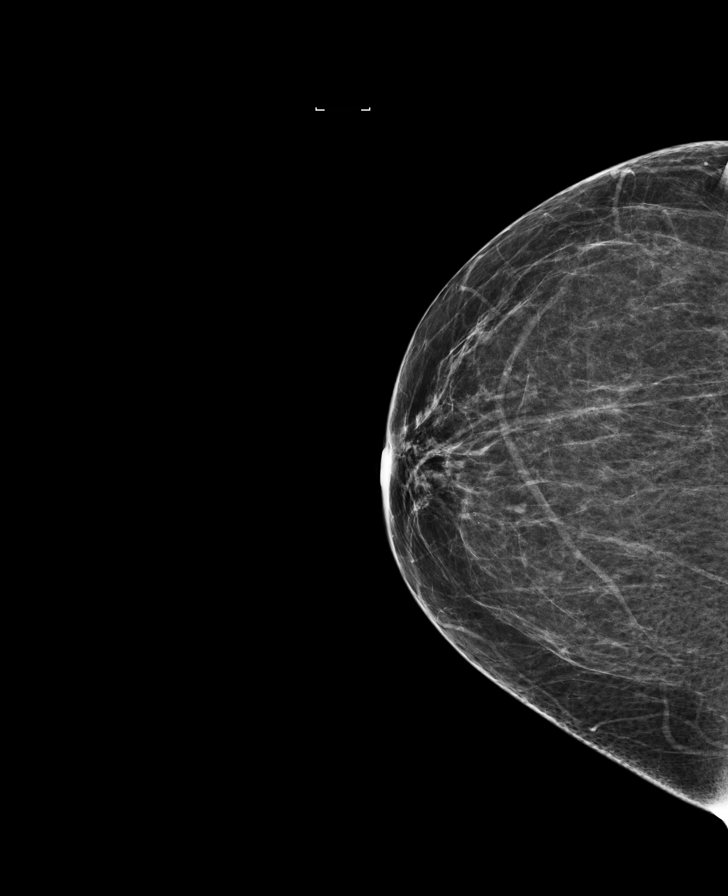

[L CC]
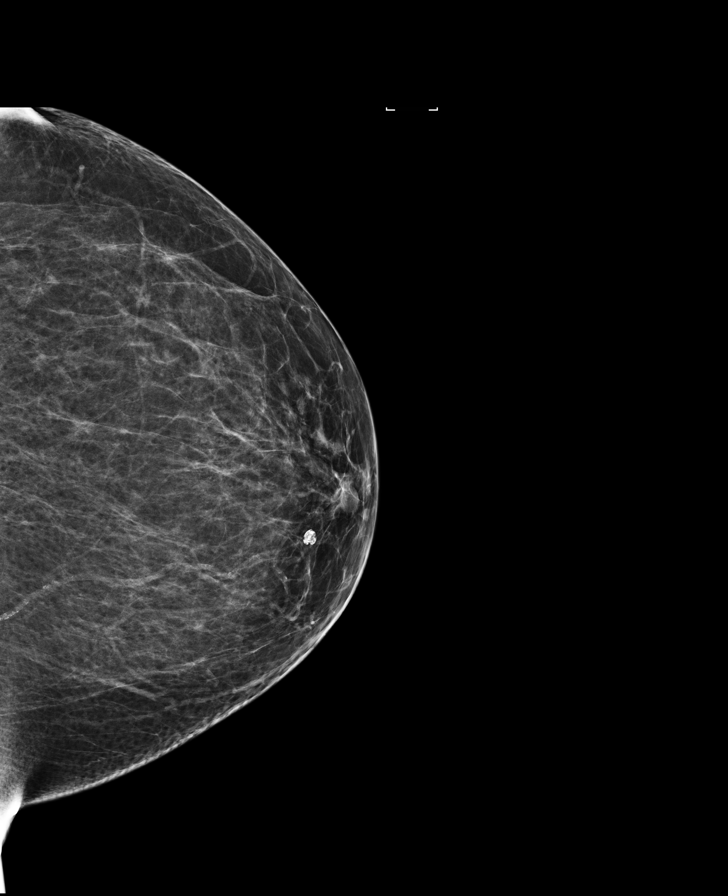

[R MLO synth-2D]
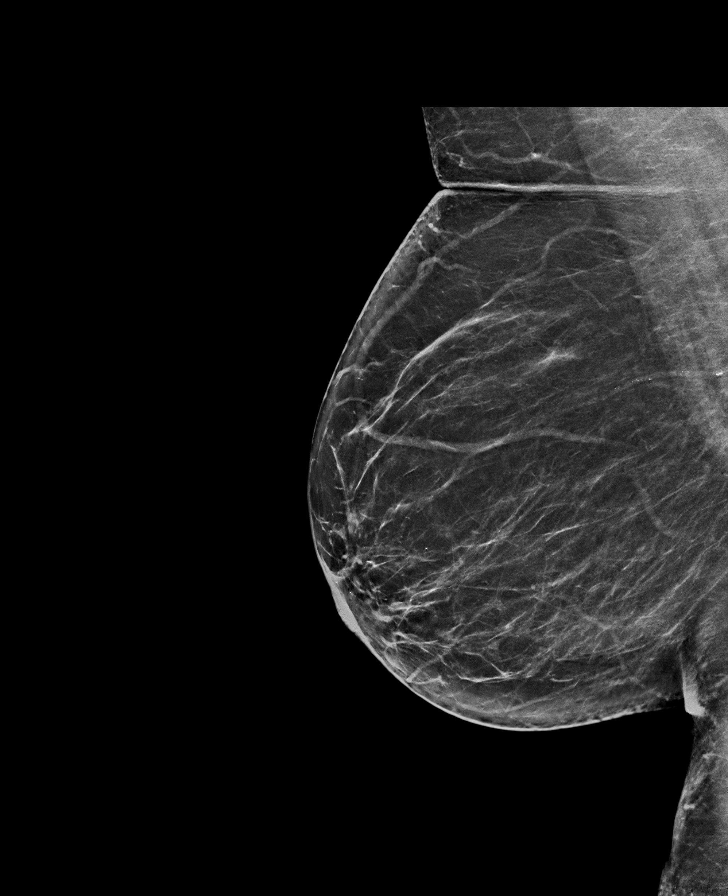

[L MLO synth-2D]
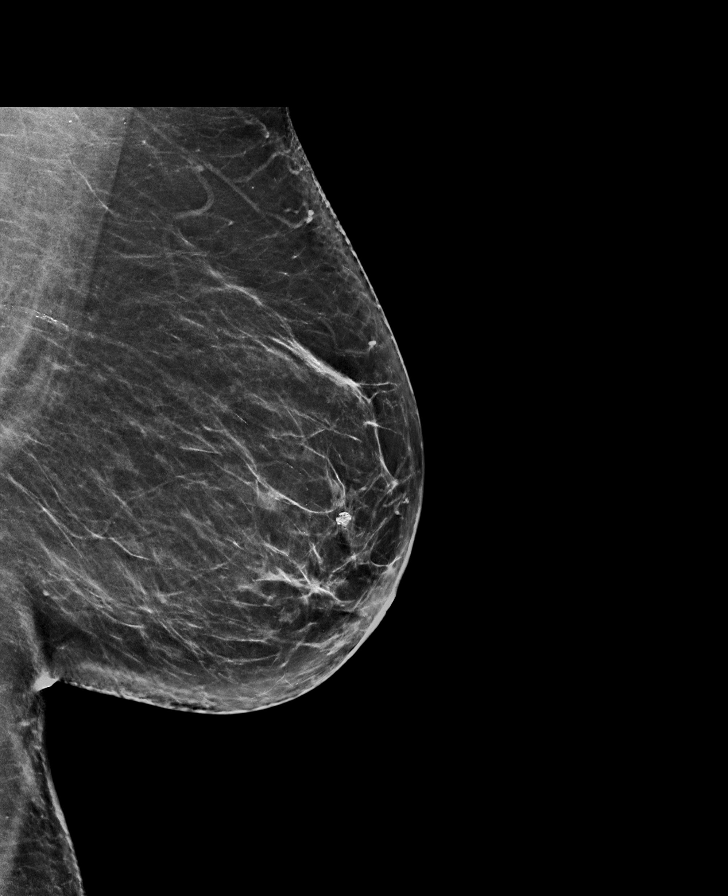

[L MLO]
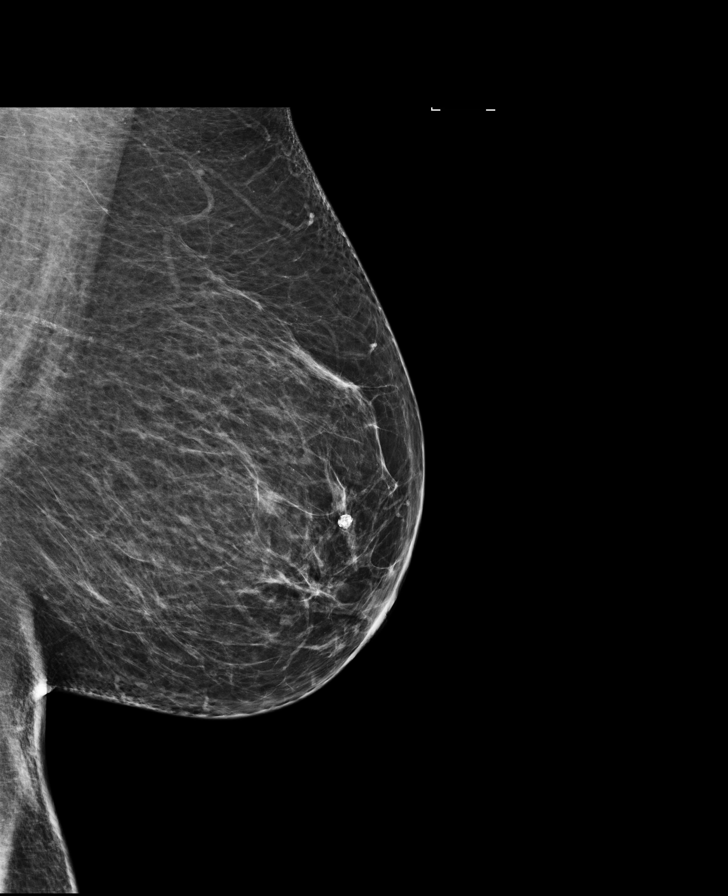

[R CC synth-2D]
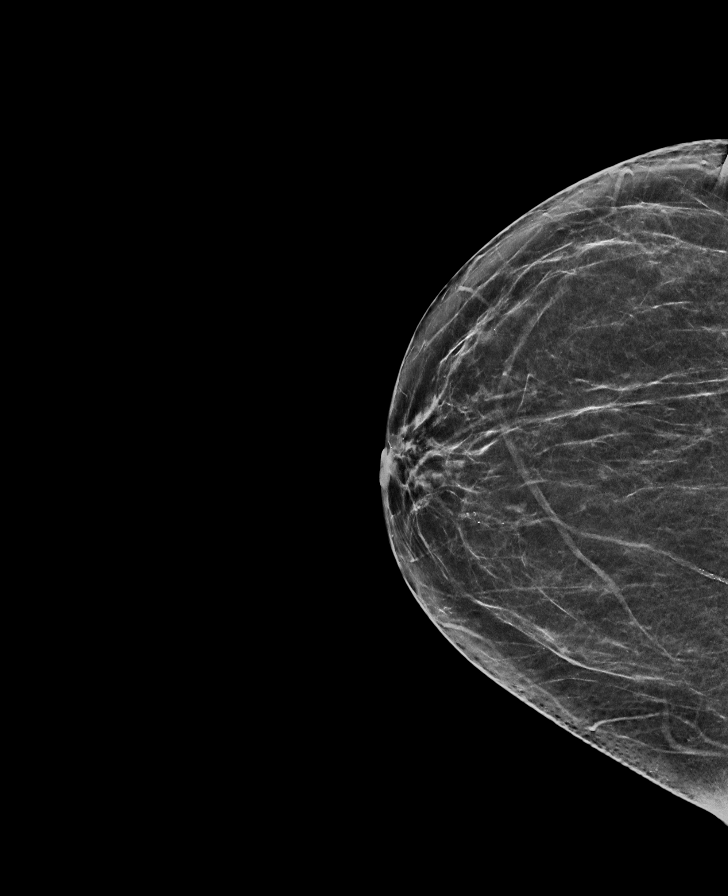

[L CC synth-2D]
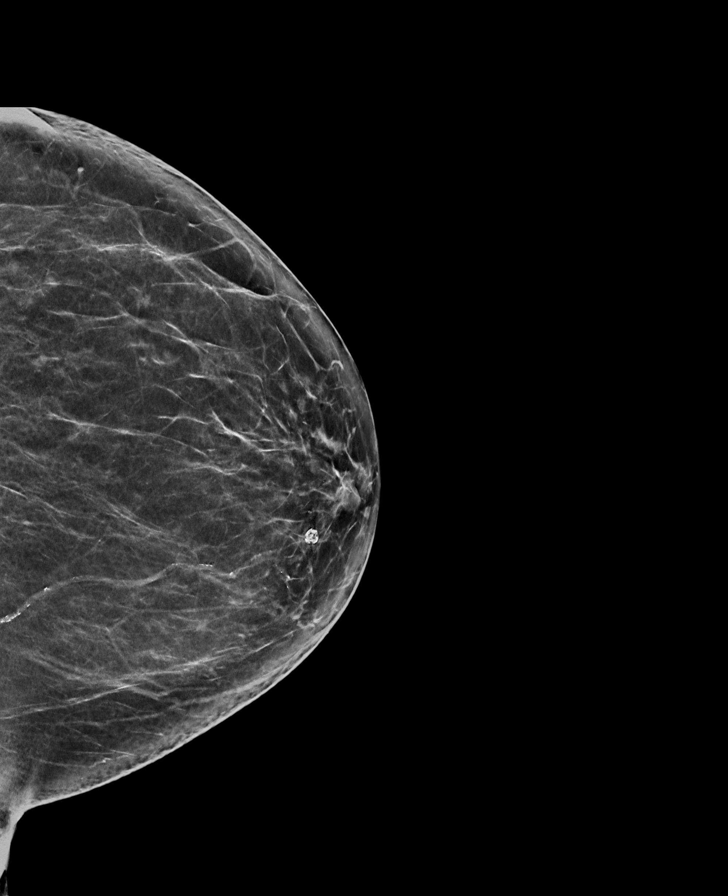

[R MLO]
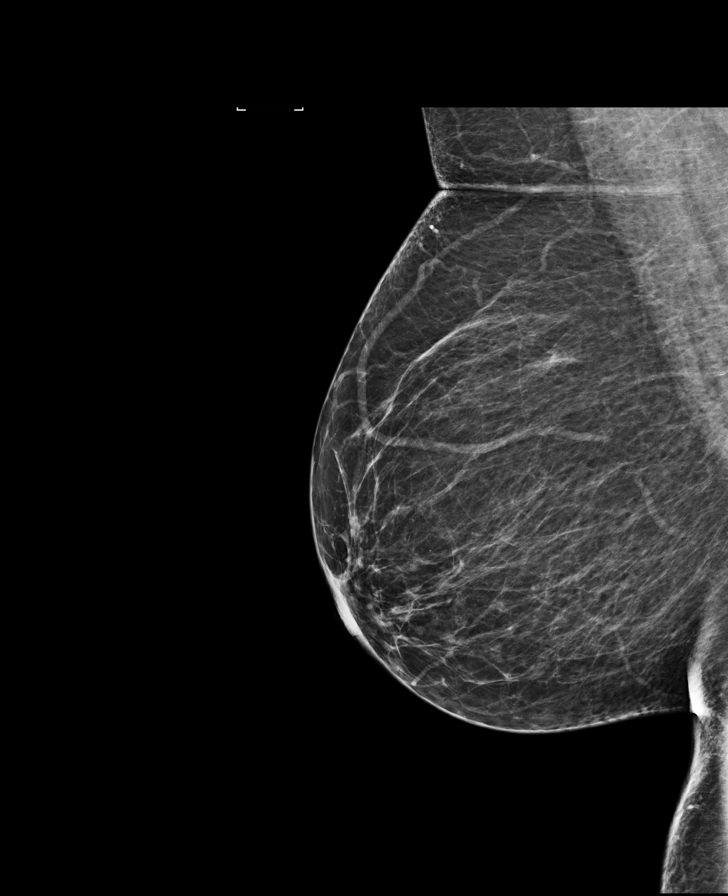

[8 of 28 positions shown; findings below may reference images not displayed]

ACR Breast Density Category b: There are scattered areas of
fibroglandular density.
FINDINGS: There are no findings suspicious for malignancy. Images were
processed with CAD.
IMPRESSION: No mammographic evidence of malignancy. A result letter of this
screening mammogram will be mailed directly to the patient.

RECOMMENDATION:
Screening mammogram in one year. (Code:97-6-RS4)

BI-RADS CATEGORY  1: Negative.

## 2018-05-13 ENCOUNTER — Telehealth: Payer: Self-pay | Admitting: Family Medicine

## 2018-05-13 NOTE — Telephone Encounter (Signed)
Patient stated that she was now seeing another provider.

## 2018-05-13 NOTE — Telephone Encounter (Signed)
Please call to get patient scheduled for appt  Due for:  A1c and other fasting labs  Hep C screen  Foot exam  Opthalmology exam for diabetics  Pap smear  Mammogram  Tdap  Pneumonia vaccine

## 2018-12-01 NOTE — Telephone Encounter (Signed)
error 

## 2021-05-16 ENCOUNTER — Other Ambulatory Visit: Payer: Self-pay | Admitting: Family Medicine

## 2021-06-04 ENCOUNTER — Other Ambulatory Visit: Payer: Self-pay | Admitting: Family Medicine

## 2021-06-06 ENCOUNTER — Other Ambulatory Visit: Payer: Self-pay | Admitting: Family Medicine

## 2021-06-06 DIAGNOSIS — Z13820 Encounter for screening for osteoporosis: Secondary | ICD-10-CM

## 2021-06-06 DIAGNOSIS — Z780 Asymptomatic menopausal state: Secondary | ICD-10-CM

## 2021-06-06 DIAGNOSIS — Z1231 Encounter for screening mammogram for malignant neoplasm of breast: Secondary | ICD-10-CM

## 2021-08-03 ENCOUNTER — Ambulatory Visit
Admission: RE | Admit: 2021-08-03 | Discharge: 2021-08-03 | Disposition: A | Discharge status: Home / Self Care | Payer: Medicare HMO | Source: Ambulatory Visit | Attending: Family Medicine | Admitting: Family Medicine

## 2021-08-03 DIAGNOSIS — Z1231 Encounter for screening mammogram for malignant neoplasm of breast: Secondary | ICD-10-CM | POA: Insufficient documentation

## 2021-08-07 ENCOUNTER — Ambulatory Visit
Admission: RE | Admit: 2021-08-07 | Discharge: 2021-08-07 | Disposition: A | Discharge status: Home / Self Care | Payer: Medicare HMO | Source: Ambulatory Visit | Attending: Family Medicine | Admitting: Family Medicine

## 2021-08-07 DIAGNOSIS — Z780 Asymptomatic menopausal state: Secondary | ICD-10-CM | POA: Insufficient documentation

## 2021-08-07 DIAGNOSIS — Z13820 Encounter for screening for osteoporosis: Secondary | ICD-10-CM | POA: Insufficient documentation

## 2021-08-09 ENCOUNTER — Other Ambulatory Visit: Payer: Self-pay | Admitting: Family Medicine

## 2021-08-09 DIAGNOSIS — N63 Unspecified lump in unspecified breast: Secondary | ICD-10-CM

## 2021-08-09 DIAGNOSIS — R928 Other abnormal and inconclusive findings on diagnostic imaging of breast: Secondary | ICD-10-CM

## 2021-08-09 DIAGNOSIS — R590 Localized enlarged lymph nodes: Secondary | ICD-10-CM

## 2021-08-15 ENCOUNTER — Ambulatory Visit
Admission: RE | Admit: 2021-08-15 | Discharge: 2021-08-15 | Disposition: A | Discharge status: Home / Self Care | Payer: Medicare HMO | Source: Ambulatory Visit | Attending: Family Medicine | Admitting: Family Medicine

## 2021-08-15 DIAGNOSIS — R928 Other abnormal and inconclusive findings on diagnostic imaging of breast: Secondary | ICD-10-CM | POA: Diagnosis present

## 2021-08-15 DIAGNOSIS — N63 Unspecified lump in unspecified breast: Secondary | ICD-10-CM | POA: Diagnosis present

## 2021-08-15 DIAGNOSIS — R590 Localized enlarged lymph nodes: Secondary | ICD-10-CM

## 2021-08-16 ENCOUNTER — Other Ambulatory Visit: Payer: Self-pay | Admitting: Family Medicine

## 2021-08-16 DIAGNOSIS — R928 Other abnormal and inconclusive findings on diagnostic imaging of breast: Secondary | ICD-10-CM

## 2021-08-16 DIAGNOSIS — N63 Unspecified lump in unspecified breast: Secondary | ICD-10-CM

## 2021-09-04 ENCOUNTER — Ambulatory Visit
Admission: RE | Admit: 2021-09-04 | Discharge: 2021-09-04 | Disposition: A | Discharge status: Home / Self Care | Payer: Medicare HMO | Source: Ambulatory Visit | Attending: Family Medicine | Admitting: Family Medicine

## 2021-09-04 DIAGNOSIS — N63 Unspecified lump in unspecified breast: Secondary | ICD-10-CM

## 2021-09-04 DIAGNOSIS — R928 Other abnormal and inconclusive findings on diagnostic imaging of breast: Secondary | ICD-10-CM | POA: Insufficient documentation

## 2021-09-04 HISTORY — PX: BREAST BIOPSY: SHX20

## 2021-09-05 ENCOUNTER — Encounter: Payer: Self-pay | Admitting: *Deleted

## 2021-09-05 NOTE — Progress Notes (Signed)
Received referral for newly diagnosed breast cancer from [facility].  Navigation initiated.  Patient would like to go to [facility] for medical oncology and surgeon.   Explained to the patient her PCP would have to initiate that referral.  I have faxed pathology results and request for PCP to send referrals to surgeon and medical oncology at [facility].  Patient will also call and follow up that these referrals were sent.

## 2021-09-06 LAB — SURGICAL PATHOLOGY

## 2022-04-15 ENCOUNTER — Other Ambulatory Visit: Payer: Self-pay

## 2022-04-15 ENCOUNTER — Emergency Department: Payer: Medicare HMO

## 2022-04-15 ENCOUNTER — Emergency Department
Admission: EM | Admit: 2022-04-15 | Discharge: 2022-04-15 | Disposition: A | Discharge status: Home / Self Care | Payer: Medicare HMO | Attending: Emergency Medicine | Admitting: Emergency Medicine

## 2022-04-15 ENCOUNTER — Encounter: Payer: Self-pay | Admitting: Radiology

## 2022-04-15 DIAGNOSIS — Z421 Encounter for breast reconstruction following mastectomy: Secondary | ICD-10-CM | POA: Diagnosis not present

## 2022-04-15 DIAGNOSIS — R112 Nausea with vomiting, unspecified: Secondary | ICD-10-CM

## 2022-04-15 DIAGNOSIS — E119 Type 2 diabetes mellitus without complications: Secondary | ICD-10-CM | POA: Diagnosis not present

## 2022-04-15 DIAGNOSIS — R197 Diarrhea, unspecified: Secondary | ICD-10-CM | POA: Insufficient documentation

## 2022-04-15 LAB — COMPREHENSIVE METABOLIC PANEL
ALT: 22 U/L (ref 0–44)
AST: 36 U/L (ref 15–41)
Albumin: 4.2 g/dL (ref 3.5–5.0)
Alkaline Phosphatase: 74 U/L (ref 38–126)
Anion gap: 12 (ref 5–15)
BUN: 25 mg/dL — ABNORMAL HIGH (ref 8–23)
CO2: 18 mmol/L — ABNORMAL LOW (ref 22–32)
Calcium: 9.3 mg/dL (ref 8.9–10.3)
Chloride: 105 mmol/L (ref 98–111)
Creatinine, Ser: 0.86 mg/dL (ref 0.44–1.00)
GFR, Estimated: >60 mL/min (ref >60)
Glucose, Bld: 175 mg/dL — ABNORMAL HIGH (ref 70–99)
Potassium: 3 mmol/L — ABNORMAL LOW (ref 3.5–5.1)
Sodium: 135 mmol/L (ref 135–145)
Total Bilirubin: 0.8 mg/dL (ref 0.3–1.2)
Total Protein: 8 g/dL (ref 6.5–8.1)

## 2022-04-15 LAB — MAGNESIUM: Magnesium: 1.7 mg/dL (ref 1.7–2.4)

## 2022-04-15 LAB — CBC
HCT: 44.8 % (ref 36.0–46.0)
Hemoglobin: 15.3 g/dL — ABNORMAL HIGH (ref 12.0–15.0)
MCH: 28.9 pg (ref 26.0–34.0)
MCHC: 34.2 g/dL (ref 30.0–36.0)
MCV: 84.7 fL (ref 80.0–100.0)
Platelets: 215 10*3/uL (ref 150–400)
RBC: 5.29 MIL/uL — ABNORMAL HIGH (ref 3.87–5.11)
RDW: 13.1 % (ref 11.5–15.5)
WBC: 5.7 10*3/uL (ref 4.0–10.5)
nRBC: 0 % (ref 0.0–0.2)

## 2022-04-15 LAB — LIPASE, BLOOD: Lipase: 59 U/L — ABNORMAL HIGH (ref 11–51)

## 2022-04-15 LAB — CBG MONITORING, ED: Glucose-Capillary: 170 mg/dL — ABNORMAL HIGH (ref 70–99)

## 2022-04-15 MED ORDER — ONDANSETRON HCL 4 MG PO TABS: 4.0000 mg | ORAL_TABLET | Freq: Every day | ORAL | 1 refills | Status: AC | PRN

## 2022-04-15 MED ORDER — ONDANSETRON HCL 4 MG PO TABS: 4.0000 mg | ORAL_TABLET | Freq: Every day | ORAL | 1 refills | Status: DC | PRN

## 2022-04-15 MED ORDER — SODIUM CHLORIDE 0.9 % IV BOLUS
1000.0000 mL | Freq: Once | INTRAVENOUS | Status: AC
  Administered 2022-04-15: 1000 mL via INTRAVENOUS

## 2022-04-15 MED ORDER — LOPERAMIDE HCL 2 MG PO TABS: 2.0000 mg | ORAL_TABLET | Freq: Three times a day (TID) | ORAL | 0 refills | Status: DC | PRN

## 2022-04-15 MED ORDER — ONDANSETRON 4 MG PO TBDP
4.0000 mg | ORAL_TABLET | Freq: Once | ORAL | Status: AC | PRN
Start: 2022-04-15 — End: 2022-04-15
  Administered 2022-04-15: 4 mg via ORAL
  Filled 2022-04-15: qty 1

## 2022-04-15 MED ORDER — POTASSIUM CHLORIDE CRYS ER 20 MEQ PO TBCR
40.0000 meq | EXTENDED_RELEASE_TABLET | Freq: Once | ORAL | Status: AC
  Administered 2022-04-15: 40 meq via ORAL
  Filled 2022-04-15: qty 2

## 2022-04-15 MED ORDER — ONDANSETRON HCL 4 MG/2ML IJ SOLN
4.0000 mg | Freq: Once | INTRAMUSCULAR | Status: AC
  Administered 2022-04-15: 4 mg via INTRAVENOUS
  Filled 2022-04-15: qty 2

## 2022-04-15 MED ORDER — LOPERAMIDE HCL 2 MG PO TABS: 2.0000 mg | ORAL_TABLET | Freq: Three times a day (TID) | ORAL | 0 refills | Status: AC | PRN

## 2022-04-15 MED ORDER — IOHEXOL 300 MG/ML  SOLN
100.0000 mL | Freq: Once | INTRAMUSCULAR | Status: AC | PRN
  Administered 2022-04-15: 100 mL via INTRAVENOUS

## 2022-04-15 NOTE — ED Provider Notes (Signed)
[facility] Provider Note    Event Date/Time   First MD Initiated Contact with Patient 04/15/22 1501     (approximate)   History   Diarrhea   HPI  Michaela Tucker is a 67 y.o. female   Past medical history of insulin-dependent diabetes type 2, hyperlipidemia, anxiety and depression and breast cancer s/p resection and radiation therapy who presents to the emergency department with 4 days of watery diarrhea.  She just finished a course of Augmentin for sinus infection a couple weeks ago.  She has cramping abdominal pain.  No bleeding.  No fever.  Today she developed some nausea and vomiting as well.  No known sick contacts, travel, hospitalizations.  She denies urinary symptoms.  She denies respiratory infectious symptoms.  Independent Historian contributed to assessment above: Patient's daughter at bedside      Physical Exam   Triage Vital Signs: ED Triage Vitals  Enc Vitals Group     BP 04/15/22 1420 (!) 175/91     Pulse Rate 04/15/22 1420 88     Resp 04/15/22 1420 20     Temp 04/15/22 1420 98.1 F (36.7 C)     Temp Source 04/15/22 1420 Oral     SpO2 04/15/22 1420 99 %     Weight 04/15/22 1421 229 lb 4.5 oz (104 kg)     Height 04/15/22 1421 '5\' 5"'$  (1.651 m)     Head Circumference --      Peak Flow --      Pain Score 04/15/22 1421 0     Pain Loc --      Pain Edu? --      Excl. in [place]? --     Most recent vital signs: Vitals:   04/15/22 1420  BP: (!) 175/91  Pulse: 88  Resp: 20  Temp: 98.1 F (36.7 C)  SpO2: 99%    General: Awake, no distress.  CV:  Good peripheral perfusion.  Resp:  Normal effort.  Abd:  No distention. Other:  Awake alert comfortable hypertensive but otherwise vital signs within normal limits abdomen is soft in all quadrants but she winces with pain everywhere I palpate.   ED Results / Procedures / Treatments   Labs (all labs ordered are listed, but only abnormal results are displayed) Labs Reviewed   LIPASE, BLOOD - Abnormal; Notable for the following components:      Result Value   Lipase 59 (*)    All other components within normal limits  COMPREHENSIVE METABOLIC PANEL - Abnormal; Notable for the following components:   Potassium 3.0 (*)    CO2 18 (*)    Glucose, Bld 175 (*)    BUN 25 (*)    All other components within normal limits  CBC - Abnormal; Notable for the following components:   RBC 5.29 (*)    Hemoglobin 15.3 (*)    All other components within normal limits  CBG MONITORING, ED - Abnormal; Notable for the following components:   Glucose-Capillary 170 (*)    All other components within normal limits  GASTROINTESTINAL PANEL BY PCR, STOOL (REPLACES STOOL CULTURE)  C DIFFICILE QUICK SCREEN W PCR REFLEX    MAGNESIUM  URINALYSIS, ROUTINE W REFLEX MICROSCOPIC     I ordered and reviewed the above labs they are notable for lipase is slightly high at 59 potassium is low at 3.0 hemoglobin is 15.3 slightly elevated from prior   RADIOLOGY I independently reviewed and interpreted CT scan of the  abdomen pelvis see no obvious infectious or obstructive patterns   PROCEDURES:  Critical Care performed: No  Procedures   MEDICATIONS ORDERED IN ED: Medications  ondansetron (ZOFRAN-ODT) disintegrating tablet 4 mg (4 mg Oral Given 04/15/22 1425)  sodium chloride 0.9 % bolus 1,000 mL (1,000 mLs Intravenous New Bag/Given 04/15/22 1610)  potassium chloride SA (KLOR-CON M) CR tablet 40 mEq (40 mEq Oral Given 04/15/22 1618)  ondansetron (ZOFRAN) injection 4 mg (4 mg Intravenous Given 04/15/22 1619)  iohexol (OMNIPAQUE) 300 MG/ML solution 100 mL (100 mLs Intravenous Contrast Given 04/15/22 1641)   IMPRESSION / MDM / ASSESSMENT AND PLAN / ED COURSE  I reviewed the triage vital signs and the nursing notes.                                Patient's presentation is most consistent with acute presentation with potential threat to life or bodily function.  Differential diagnosis includes,  but is not limited to, infectious colitis, dehydration, electrolyte derangement, intra-abdominal infection/abscess, viral gastroenteritis, C. difficile   The patient is on the cardiac monitor to evaluate for evidence of arrhythmia and/or significant heart rate changes.  MDM: Patient with profuse diarrhea but no fever or blood.  She did finish her course of antibiotics preceding.  Will check stool studies as well as C. difficile.  Medicate with IV crystalloids, antiemetic.  Given tenderness to palpation diffusely in the abdomen we will check a CT scan for more complicated infection/surgical abdomen.   Given no fever or bloody stools, no travelers diarrhea risk factors and will defer on antibiotics at this time especially in the setting of improvement of symptoms today.  I considered hospitalization for admission or observation however given unremarkable workup as above and stable patient who actually made 1 formed stool while in the emergency department I think she can go home with close follow-up.        FINAL CLINICAL IMPRESSION(S) / ED DIAGNOSES   Final diagnoses:  Nausea vomiting and diarrhea     Rx / DC Orders   ED Discharge Orders          Ordered    ondansetron (ZOFRAN) 4 MG tablet  Daily PRN,   Status:  Discontinued        04/15/22 1752    loperamide (IMODIUM A-D) 2 MG tablet  3 times daily PRN,   Status:  Discontinued        04/15/22 1752    loperamide (IMODIUM A-D) 2 MG tablet  3 times daily PRN        04/15/22 1817    ondansetron (ZOFRAN) 4 MG tablet  Daily PRN        04/15/22 1817             Note:  This document was prepared using Dragon voice recognition software and may include unintentional dictation errors.    Lucillie Garfinkel, MD 04/15/22 [phone]

## 2022-04-15 NOTE — ED Triage Notes (Signed)
Pt presents to the ED via POV due to diarrhea that started Friday. Pt states she its been nonstop. Pt states she has abdominal pain when she has to go to the bathroom. Pt is vomiting in triage. Pt A&Ox4

## 2022-04-15 NOTE — Discharge Instructions (Signed)
Well-hydrated plenty fluids.  Find Pedialyte or similar electrolyte rehydration formula at local pharmacy.  Take Zofran for nausea as needed.  Take loperamide for loose stools as needed.  Thank you for choosing [place] for your health care today!  Please see your primary doctor this week for a follow up appointment.   Sometimes, in the early stages of certain disease courses it is difficult to detect in the emergency department evaluation -- so, it is important that you continue to monitor your symptoms and call your doctor right away or return to the emergency department if you develop any new or worsening symptoms.  Please go to the following website to schedule new (and existing) patient appointments:   [email or web address]  If you do not have a primary doctor try calling the following clinics to establish care:  If you have insurance:  [facility] [phone] [place] [address]   [facility]  [phone] [place] [address]   If you do not have insurance:  [facility]  [phone] [address], [place] [address]   The following is another list of primary care offices in the area who are accepting new patients at this time.  Please reach out to one of them directly and let them know you would like to schedule an appointment to follow up on an Emergency Department visit, and/or to establish a new primary care provider (PCP).  There are likely other primary care clinics in the are who are accepting new patients, but this is an excellent place to start:  [place] physician: Dr Lavon Paganini [address] [place], [place] [address] [phone]  [facility] Lead Physician: Dr Steele Sizer [address], [place], [place] [address] [phone]  [place] [facility] Physician: Dr Park Liter [address] [place], [place] [address] [phone]  [facility] Lead Physician: Dr Dewaine Oats [place], [place], [place] [address] [phone]  [facility] Physician: Dr Halina Maidens [address] [place], [place] [address] [phone]   It was my pleasure to care for you today.   [place] Brunette Jacelyn Grip, MD

## 2023-07-02 ENCOUNTER — Ambulatory Visit: Payer: Medicare HMO | Admitting: Dermatology
